# Patient Record
Sex: Female | Born: 1985 | Race: White | Hispanic: No | Marital: Married | Smoking: Never smoker
Health system: Southern US, Community
[De-identification: ages and names within clinical notes are randomized; demographics above are authoritative.]

## PROBLEM LIST (undated history)

## (undated) DIAGNOSIS — D649 Anemia, unspecified: Secondary | ICD-10-CM

## (undated) HISTORY — DX: Anemia, unspecified: D64.9

---
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
## 2006-01-09 ENCOUNTER — Ambulatory Visit: Payer: Self-pay | Admitting: Internal Medicine

## 2006-02-12 ENCOUNTER — Ambulatory Visit: Payer: Self-pay | Admitting: Otolaryngology

## 2006-02-15 ENCOUNTER — Emergency Department: Payer: Self-pay | Admitting: Emergency Medicine

## 2011-09-22 ENCOUNTER — Ambulatory Visit: Payer: Self-pay | Admitting: Internal Medicine

## 2011-09-22 LAB — RAPID INFLUENZA A&B ANTIGENS

## 2012-08-29 ENCOUNTER — Emergency Department: Payer: Self-pay | Admitting: Emergency Medicine

## 2013-12-23 IMAGING — CT CT HEAD WITHOUT CONTRAST
1 series · 16 of 30 positions shown, 20 images · non-contrast
Comparison: none

REASON FOR EXAM: headache & dizziness
COMMENTS:   May transport without cardiac monitor

PROCEDURE:     CT  - CT HEAD WITHOUT CONTRAST  - August 29, 2012  [DATE]
RESULT:     Technique: Helical 5mm sections were obtained from the skull
base to the vertex without administration of intravenous contrast.

[Series 2: soft tissue · axial · 0.42mm/px · z∈[+108,+242]mm · 16 of 30 slices shown, 20 images]
[im 2/30  brain]
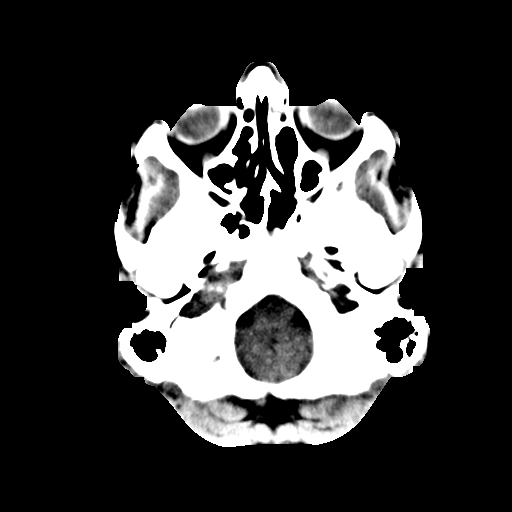
[im 2/30  bone]
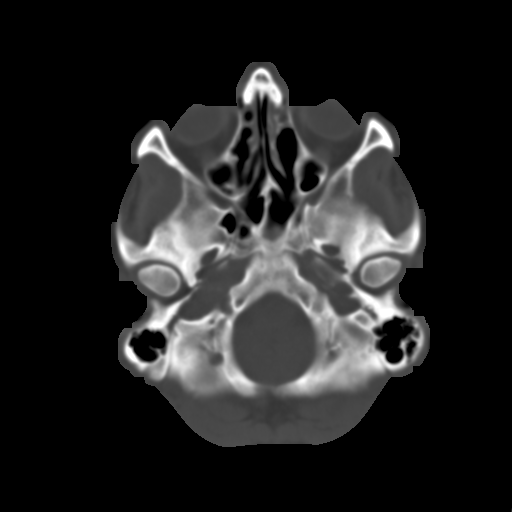
[im 4/30  brain]
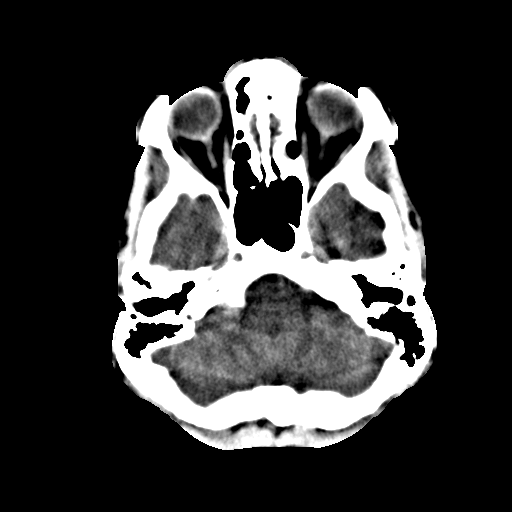
[im 6/30  brain]
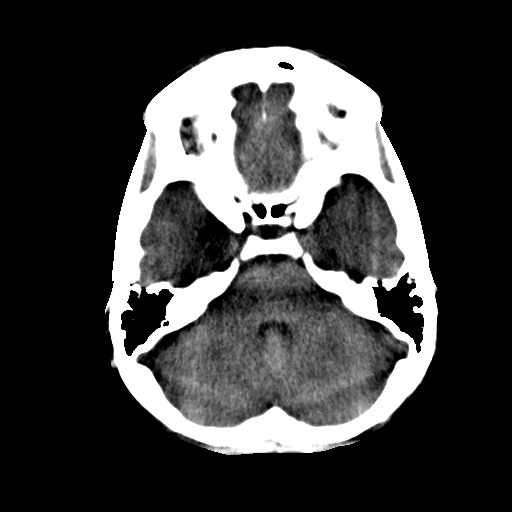
[im 8/30  brain]
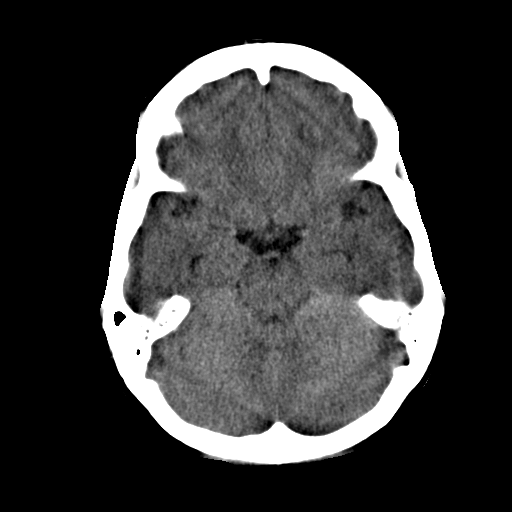
[im 9/30  brain]
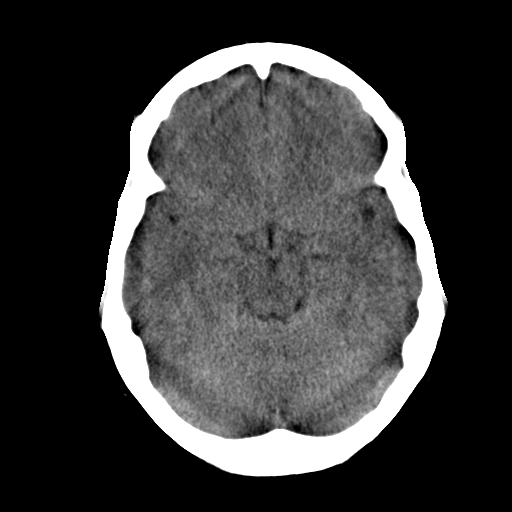
[im 9/30  bone]
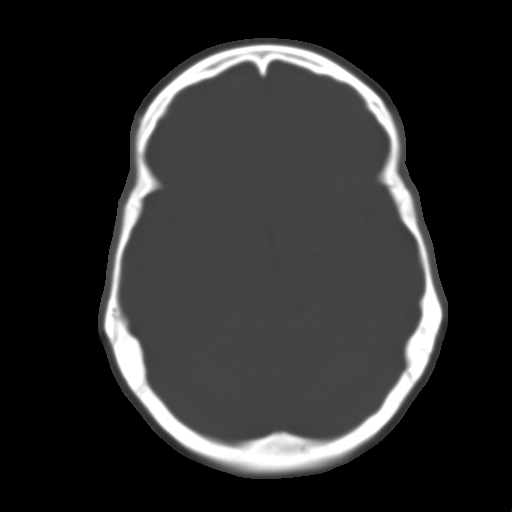
[im 11/30  brain]
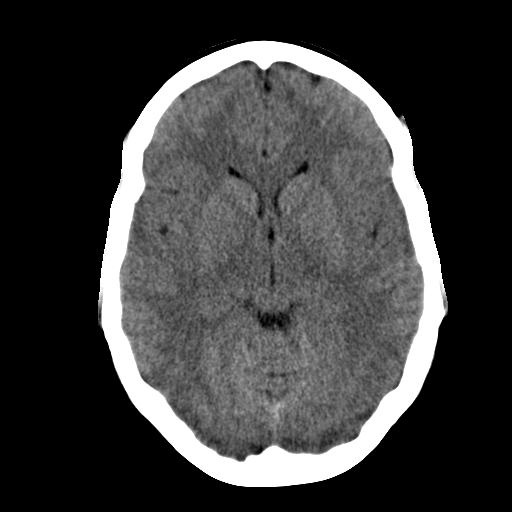
[im 13/30  brain]
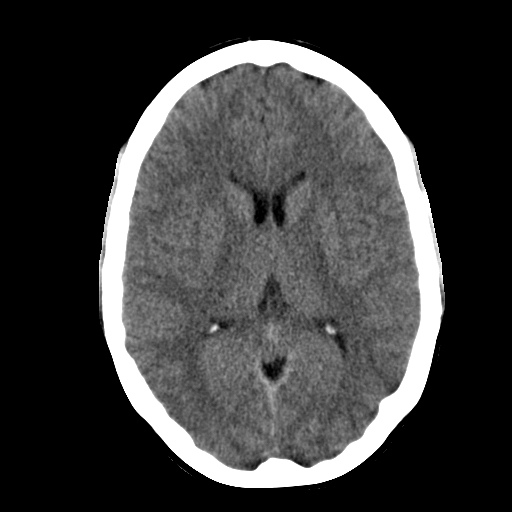
[im 15/30  brain]
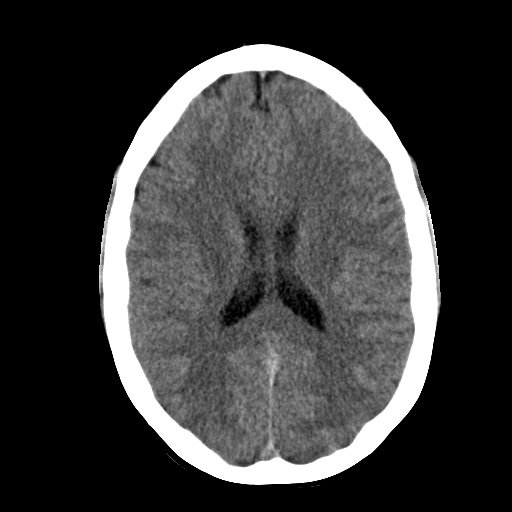
[im 16/30  brain]
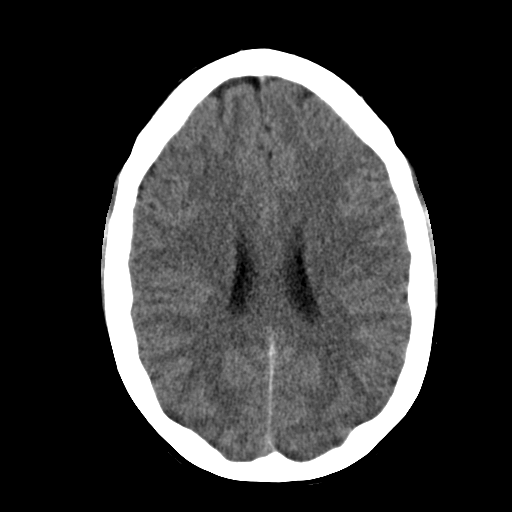
[im 16/30  bone]
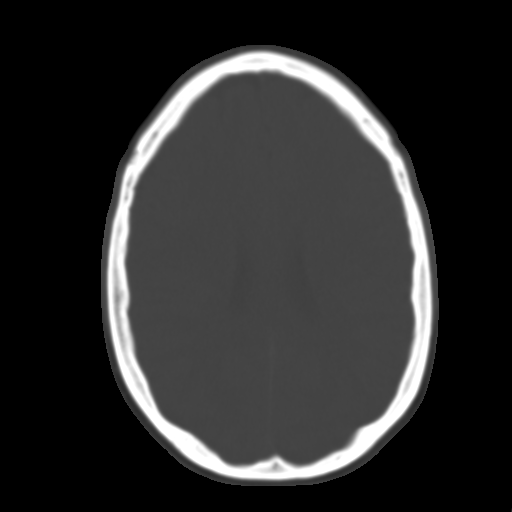
[im 18/30  brain]
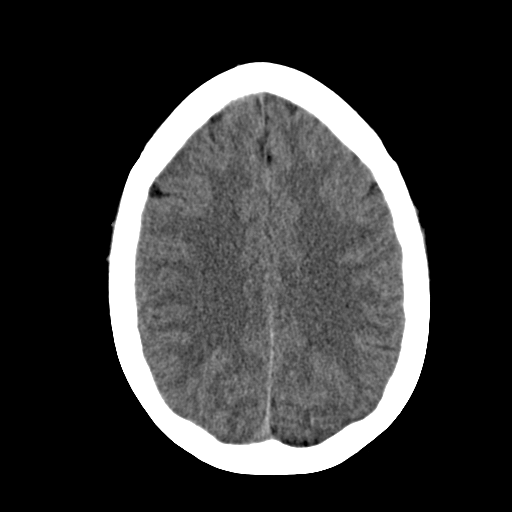
[im 20/30  brain]
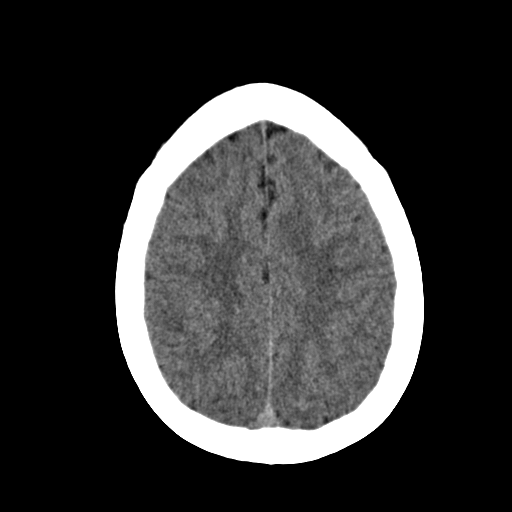
[im 22/30  brain]
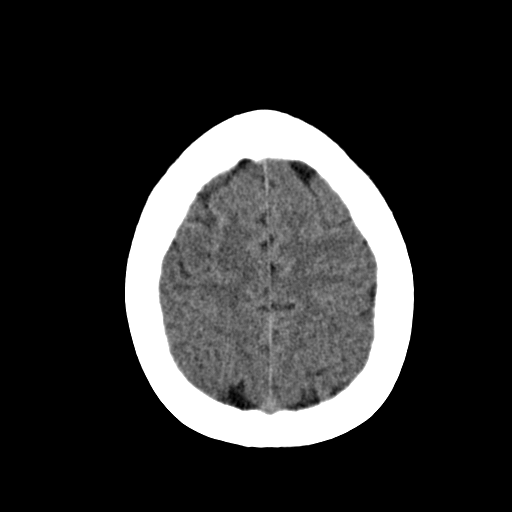
[im 23/30  brain]
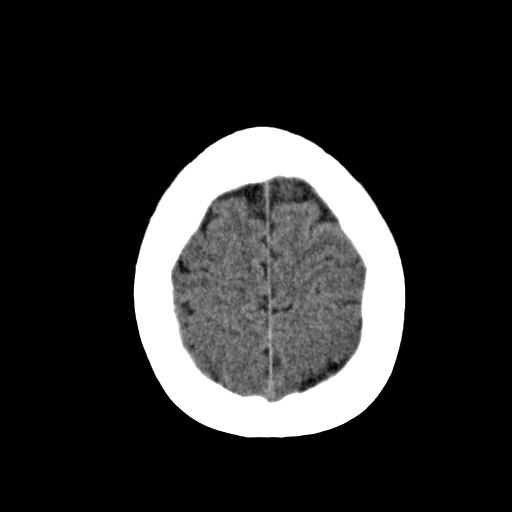
[im 23/30  bone]
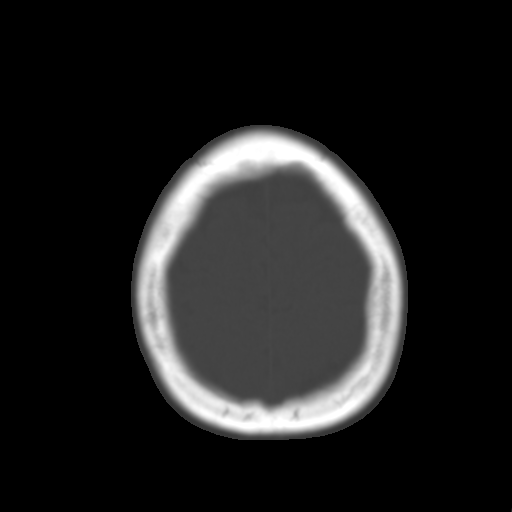
[im 25/30  brain]
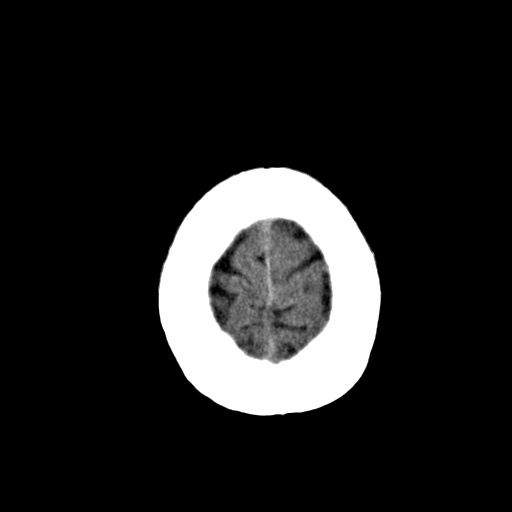
[im 27/30  brain]
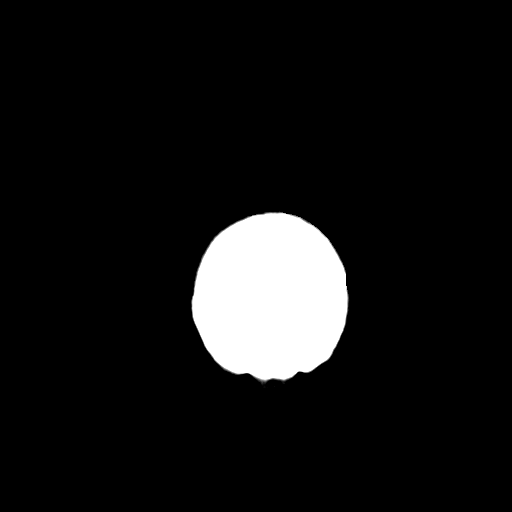
[im 29/30  brain]
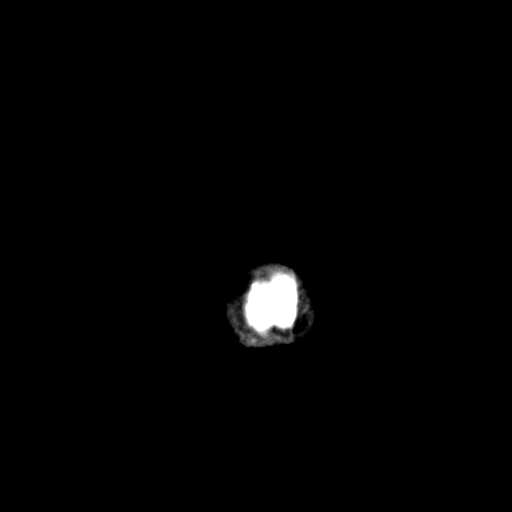

[16 of 30 positions shown; findings below may reference images not displayed]

FINDINGS: There is not evidence of intra-axial fluid collections. There is
no evidence of acute hemorrhage or secondary signs reflecting mass effect or
subacute or chronic focal territorial infarction. The osseous structures
demonstrate no evidence of a depressed skull fracture. If there is
persistent concern clinical follow-up with MRI is recommended. Mucosal
thickening is appreciated within the ethmoid air cells. The mastoid air
cells are patent.
IMPRESSION: 1. No evidence of acute intracranial abnormalities.
2. Findings which may represent sinus disease.

## 2014-12-15 DIAGNOSIS — G43909 Migraine, unspecified, not intractable, without status migrainosus: Secondary | ICD-10-CM | POA: Insufficient documentation

## 2014-12-15 HISTORY — DX: Migraine, unspecified, not intractable, without status migrainosus: G43.909

## 2015-05-31 DIAGNOSIS — M545 Low back pain, unspecified: Secondary | ICD-10-CM | POA: Insufficient documentation

## 2016-09-10 DIAGNOSIS — Z3403 Encounter for supervision of normal first pregnancy, third trimester: Secondary | ICD-10-CM | POA: Insufficient documentation

## 2016-12-06 DIAGNOSIS — J069 Acute upper respiratory infection, unspecified: Secondary | ICD-10-CM | POA: Insufficient documentation

## 2016-12-30 DIAGNOSIS — Z789 Other specified health status: Secondary | ICD-10-CM

## 2016-12-30 HISTORY — DX: Other specified health status: Z78.9

## 2019-08-06 ENCOUNTER — Ambulatory Visit: Payer: BC Managed Care – PPO | Admitting: Obstetrics and Gynecology

## 2019-09-20 ENCOUNTER — Ambulatory Visit: Payer: BC Managed Care – PPO | Admitting: Nurse Practitioner

## 2019-09-20 ENCOUNTER — Encounter: Payer: Self-pay | Admitting: Nurse Practitioner

## 2019-09-20 ENCOUNTER — Other Ambulatory Visit: Payer: Self-pay

## 2019-09-20 ENCOUNTER — Telehealth: Payer: Self-pay | Admitting: Nurse Practitioner

## 2019-09-20 VITALS — BP 132/78 | HR 90 | Temp 97.1°F | Resp 16 | Ht 64.5 in | Wt 149.0 lb

## 2019-09-20 DIAGNOSIS — H6123 Impacted cerumen, bilateral: Secondary | ICD-10-CM | POA: Diagnosis not present

## 2019-09-20 DIAGNOSIS — Z0000 Encounter for general adult medical examination without abnormal findings: Secondary | ICD-10-CM | POA: Diagnosis not present

## 2019-09-20 DIAGNOSIS — D509 Iron deficiency anemia, unspecified: Secondary | ICD-10-CM

## 2019-09-20 DIAGNOSIS — R5383 Other fatigue: Secondary | ICD-10-CM | POA: Insufficient documentation

## 2019-09-20 DIAGNOSIS — R030 Elevated blood-pressure reading, without diagnosis of hypertension: Secondary | ICD-10-CM

## 2019-09-20 DIAGNOSIS — R6889 Other general symptoms and signs: Secondary | ICD-10-CM | POA: Diagnosis not present

## 2019-09-20 LAB — COMPREHENSIVE METABOLIC PANEL
ALT: 12 U/L (ref 0–35)
AST: 18 U/L (ref 0–37)
Albumin: 4.5 g/dL (ref 3.5–5.2)
Alkaline Phosphatase: 55 U/L (ref 39–117)
BUN: 13 mg/dL (ref 6–23)
CO2: 25 mEq/L (ref 19–32)
Calcium: 9.6 mg/dL (ref 8.4–10.5)
Chloride: 107 mEq/L (ref 96–112)
Creatinine, Ser: 0.86 mg/dL (ref 0.40–1.20)
GFR: 76.35 mL/min (ref >60.00)
Glucose, Bld: 103 mg/dL — ABNORMAL HIGH (ref 70–99)
Potassium: 4 mEq/L (ref 3.5–5.1)
Sodium: 138 mEq/L (ref 135–145)
Total Bilirubin: 0.3 mg/dL (ref 0.2–1.2)
Total Protein: 7.6 g/dL (ref 6.0–8.3)

## 2019-09-20 LAB — LIPID PANEL
Cholesterol: 176 mg/dL (ref 0–200)
HDL: 64.6 mg/dL (ref >39.00)
LDL Cholesterol: 91 mg/dL (ref 0–99)
NonHDL: 111.3
Total CHOL/HDL Ratio: 3
Triglycerides: 102 mg/dL (ref 0.0–149.0)
VLDL: 20.4 mg/dL (ref 0.0–40.0)

## 2019-09-20 LAB — VITAMIN D 25 HYDROXY (VIT D DEFICIENCY, FRACTURES): VITD: 27.48 ng/mL — ABNORMAL LOW (ref 30.00–100.00)

## 2019-09-20 LAB — CBC
HCT: 33.1 % — ABNORMAL LOW (ref 36.0–46.0)
Hemoglobin: 10.6 g/dL — ABNORMAL LOW (ref 12.0–15.0)
MCHC: 32.2 g/dL (ref 30.0–36.0)
MCV: 69.8 fl — ABNORMAL LOW (ref 78.0–100.0)
Platelets: 226 10*3/uL (ref 150.0–400.0)
RBC: 4.74 Mil/uL (ref 3.87–5.11)
RDW: 16.7 % — ABNORMAL HIGH (ref 11.5–15.5)
WBC: 4.4 10*3/uL (ref 4.0–10.5)

## 2019-09-20 LAB — TSH: TSH: 3.33 u[IU]/mL (ref 0.35–4.50)

## 2019-09-20 NOTE — Patient Instructions (Addendum)
It was nice to meet you today.  Please see ENT to assist with cerumen impaction.  We were able to clean out the right ear with irrigation, the left ear remained fully impacted.  You have an elevated resting heart rate, and blood pressures above ideal goal which is less than 120/80. This may simply be due to feeling anxious during our first office visit.   Please check your heart rate and blood pressure with a cuff at home a couple times a week and write those records down.  We can reevaluate in 3 months.  First step is lower salt diet, regular exercise and healthy diet.   I have ordered your routine labs. You tested negative for depression and anxiety.   Follow up with your readings in 3 mos.   Addendum:  LABS returned with ANEMIA in a pattern of iron deficiency. This is likely form your 2018 child birth - Hgb 11.7 and your menses. Talk to our GYN about this . I  have added more blood work to look into this further.  Do not donate any blood.  Anemia can cause your resting heart rate to be elevated.   PLAN: 1. I added labs to check on the severity of the IDA.  2. Stool cards to be picked up at our front desk- to complete at home looking for hidden blood in your stool. 3.  Begin over the counter IRON: Ferrous sulfate 325 mg 1 pill every  day. Avoid taking it 2 hours from calcium-antacids, cereals, dietary fiber, teas, coffee and eggs.  4. Come in for repeat CBC in 6 weeks to check the results.  5. If you get stomach upset and constipation that a stool softener does not help, call me back.  CRITICAL ALERT: Severe iron toxicity is possible and overdose especially in children.  Store out of reach of children and in a child resistant container.  Iron overdose is a leading cause of poisoning in children younger than 51 years old. Storing your iron up high and in child proof container is very important.

## 2019-09-20 NOTE — Progress Notes (Signed)
Subjective:    Patient ID: Denise Cox, female    DOB: 06/26/1986, 34 y.o.   MRN: [id number]  HPI  34 yo comes in to establish care with a PCP. She declines flu shot.   1. Fatigue/cold intolerance: She reports a borderline normal thyroid level in the past and she wants to get it checked again because she has fatigue and cold intolerance. She has not had labs done in 5-6 years. She has been a full-time mother to her 20 1/2 yo and used to be an Environmental consultant principal. She is doing well and denies any feelings of depression. She is happy to be home.   2. Cerumen impaction: She feels like she has impacted wax in both her ears. She has never needed to get her ears cleaned out before. She has no sinus concerns now. 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 Some allergies and minor sinus congestion-does not treat.She started Claritin 2 days ago.   3. Weight gain: Some weight gain. She is eating better. Exercises regulary.   4. Tachycardia and elevated BP noted on intake vitals: Asymptomatic-unaware. No CP, SOB, DOE. She reports stress with new provider and medical anxiety.   Social: Wine one glass less than weekly. No smoking. She has used condoms for protection and will see GYN in 2 weeks for PAP. Mod-heavy menses.  FH: M HTN,  Mgm Hashimoto, Mat aunt Hashimoto   Past Medical History:  Diagnosis Date  . Anemia 2041m   With pregnancy Hgb 11.7  . Cold intolerance 09/20/2019     Past Surgical History:  Procedure Laterality Date  . ANKLE SURGERY Right 1998  . SINUSOTOMY  2001  . [place] EXTRACTION  2000    History reviewed. No pertinent family history.  Allergies  Allergen Reactions  . Augmentin [Amoxicillin-Pot Clavulanate] Diarrhea  . Avelox [Moxifloxacin] Other (See Comments)    Neuropathy  . Septra [Sulfamethoxazole-Trimethoprim] Diarrhea    No current outpatient medications on file prior to visit.   No current  facility-administered medications on file prior to visit.    BP 132/78   Pulse 90   Temp (!) 97.1 F (36.2 C) (Temporal)   Resp 16   Ht 5' 4.5" (1.638 m)   Wt 149 lb (67.6 kg)   LMP 08/25/2019 (Exact Date)   SpO2 99%   BMI 25.18 kg/m    Review of Systems Negative ROS with exceptions as mentions in HPI    Objective:   Physical Exam Vitals reviewed.  Constitutional:      Appearance: Normal appearance.  HENT:     Head: Normocephalic.     Right Ear: There is impacted cerumen.     Left Ear: There is impacted cerumen.     Nose: Nose normal.  Cardiovascular:     Rate and Rhythm: Regular rhythm. Tachycardia present.     Heart sounds: No murmur. No gallop.   Pulmonary:     Effort: Pulmonary effort is normal.     Breath sounds: Normal breath sounds.  Abdominal:     General: Abdomen is flat.     Palpations: Abdomen is soft.     Tenderness: There is no abdominal tenderness.  Musculoskeletal:        General: No swelling.     Cervical back: Normal range of motion.     Right lower leg: No edema.     Left lower leg: No edema.  Skin:  General: Skin is warm and dry.     Coloration: Skin is pale.     Findings: No rash.  Neurological:     Mental Status: She is alert and oriented to person, place, and time.  Psychiatric:        Mood and Affect: Mood normal.        Behavior: Behavior normal.     Comments: She denies any depression/anxiety concerns.        Assessment & Plan:   1. Cerumen impaction: Denise Cox attempted to clean out her ears.ENT to assist with cerumen impaction.  We were able to clean out the right ear with irrigation, the left ear remained fully impacted.  2. Fatigue and cold intolerance: she has thyroid concerns. Color of nailbeds pale pink. Previous anemia ain 2018 with pregnancy. Normal moderate menses  Will recheck labs.   3. Mild elevated BP and HR - may be white coat. Check lipids with labs.   She declines FLU vaccine. She plans to get the Covid vaccine  when it is offered to her.  This visit occurred during the SARS-CoV-2 public health emergency.  Safety protocols were in place, including screening questions prior to the visit, additional usage of staff PPE, and extensive cleaning of exam room while observing appropriate contact time as indicated for disinfecting solutions.   Amedeo Kinsman, ANP

## 2019-09-20 NOTE — Telephone Encounter (Signed)
I added on labs and she needs OV in 6 weeks for CBC re check.    Can you make sure lab can add these on? And, give her a sooner f/up appt?     

## 2019-09-21 ENCOUNTER — Other Ambulatory Visit: Payer: BC Managed Care – PPO

## 2019-09-21 ENCOUNTER — Telehealth: Payer: Self-pay | Admitting: Lab

## 2019-09-21 DIAGNOSIS — D509 Iron deficiency anemia, unspecified: Secondary | ICD-10-CM

## 2019-09-21 LAB — IBC + FERRITIN
Ferritin: 3.4 ng/mL — ABNORMAL LOW (ref 10.0–291.0)
Iron: 17 ug/dL — ABNORMAL LOW (ref 42–145)
Saturation Ratios: 3.6 % — ABNORMAL LOW (ref 20.0–50.0)
Transferrin: 335 mg/dL (ref 212.0–360.0)

## 2019-09-21 NOTE — Telephone Encounter (Signed)
Called Pt No answer, left a VM. I was calling Pt because NP Fransico Setters would like to see Pt sooner.  NP Fransico Setters would like to see Pt  in 6 weeks for a F/U appt and cancel the 3 month F/U appt.

## 2019-09-21 NOTE — Telephone Encounter (Signed)
Add on sheet faxed for IBC+Ferritin only

## 2019-09-21 NOTE — Telephone Encounter (Signed)
I added on labs and she needs OV in 6 weeks for CBC re check.    Can you make sure lab can add these on? And, give her a sooner f/up appt?

## 2019-09-22 ENCOUNTER — Telehealth: Payer: Self-pay | Admitting: Lab

## 2019-09-22 NOTE — Telephone Encounter (Signed)
Called Pt and told her that I have some stool cards in the office for her to pick up. I also was able to make the Pt a 6wk F/U appt on 11/03/19.

## 2019-10-04 ENCOUNTER — Encounter: Payer: Self-pay | Admitting: Obstetrics and Gynecology

## 2019-10-04 ENCOUNTER — Other Ambulatory Visit (HOSPITAL_COMMUNITY)
Admission: RE | Admit: 2019-10-04 | Discharge: 2019-10-04 | Disposition: A | Discharge status: Home / Self Care | Payer: BC Managed Care – PPO | Source: Ambulatory Visit | Attending: Obstetrics and Gynecology | Admitting: Obstetrics and Gynecology

## 2019-10-04 ENCOUNTER — Other Ambulatory Visit: Payer: Self-pay

## 2019-10-04 ENCOUNTER — Ambulatory Visit (INDEPENDENT_AMBULATORY_CARE_PROVIDER_SITE_OTHER): Payer: BC Managed Care – PPO | Admitting: Obstetrics and Gynecology

## 2019-10-04 VITALS — BP 118/74 | Ht 64.0 in | Wt 151.0 lb

## 2019-10-04 DIAGNOSIS — N941 Unspecified dyspareunia: Secondary | ICD-10-CM

## 2019-10-04 DIAGNOSIS — Z01419 Encounter for gynecological examination (general) (routine) without abnormal findings: Secondary | ICD-10-CM | POA: Insufficient documentation

## 2019-10-04 DIAGNOSIS — Z1331 Encounter for screening for depression: Secondary | ICD-10-CM

## 2019-10-04 DIAGNOSIS — Z1339 Encounter for screening examination for other mental health and behavioral disorders: Secondary | ICD-10-CM

## 2019-10-04 DIAGNOSIS — Z124 Encounter for screening for malignant neoplasm of cervix: Secondary | ICD-10-CM | POA: Diagnosis not present

## 2019-10-04 NOTE — Progress Notes (Signed)
Gynecology Annual Exam  PCP: Theadore Nan, NP  Chief Complaint  Patient presents with  . Annual Exam    History of Present Illness:  [name] is a 34 y.o. G1P1001 who LMP was Patient's last menstrual period was 09/20/2019., presents today for her annual examination.  Her menses are regular every 28-30 days, lasting 7 day(s).  Dysmenorrhea none. She does not have intermenstrual bleeding.  She is sexually active. She uses condoms for contraceptions.    Last Pap: about six years  Results were: no abnormalities  Hx of STDs: none  There is no FH of breast cancer. There is no FH of ovarian cancer. The patient does do self-breast exams.  Tobacco use: The patient denies current or previous tobacco use. Alcohol use: social drinker Exercise: chasing around her son (2.5 years)  The patient wears seatbelts: yes.   The patient reports that domestic violence in her life is absent.   She was recently diagnosed with what appears to be iron deficiency anemia.  She has started taking iron supplementation.  Her periods are not super heavy.  She wears an overnight pad, but this is not soaked.  She also has a hemorrhoid, which was bad after giving birth. She has flair ups since then, but this is significantly better.  She doesn't note any bleeding with having a bowel movement. She is having a little bit of a flare right now.    She has some pain at the onset of intercourse.  As time goes by she has less discomfort.  She has tried water based lubricants.  She states that it doesn't matter the level of penetration, though initial penetration is usually worse.    Past Medical History:  Diagnosis Date  . Anemia 2067m   With pregnancy Hgb 11.7   Past Surgical History:  Procedure Laterality Date  . ANKLE SURGERY Right 1998  . SINUSOTOMY  2001  . WISDOM TOOTH EXTRACTION  2000    Prior to Admission medications   Medication Sig Start Date End Date Taking? Authorizing Provider  ferrous  sulfate 325 (65 FE) MG tablet Take 325 mg by mouth daily with breakfast.   Yes [provider]  loratadine (CLARITIN) 10 MG tablet Take 10 mg by mouth daily.   Yes [provider]    Allergies  Allergen Reactions  . Augmentin [Amoxicillin-Pot Clavulanate] Diarrhea  . Avelox [Moxifloxacin] Other (See Comments)    Neuropathy  . Septra [Sulfamethoxazole-Trimethoprim] Diarrhea   Obstetric History: G1P1001, s/p SVD x 2 with 2nd degree laceration.   Social History   Socioeconomic History  . Marital status: Married    Spouse name: Not on file  . Number of children: Not on file  . Years of education: Not on file  . Highest education level: Not on file  Occupational History  . Not on file  Tobacco Use  . Smoking status: Never Smoker  . Smokeless tobacco: Never Used  Substance and Sexual Activity  . Alcohol use: Yes    Comment: ocassionally  . Drug use: Not Currently  . Sexual activity: Yes    Birth control/protection: None  Other Topics Concern  . Not on file  Social History Narrative  . Not on file   Social Determinants of Health   Financial Resource Strain:   . Difficulty of Paying Living Expenses:   Food Insecurity:   . Worried About Programme researcher, broadcasting/film/video in the Last Year:   . Barista in  the Last Year:   Transportation Needs:   . Freight forwarder (Medical):   Marland Kitchen Lack of Transportation (Non-Medical):   Physical Activity:   . Days of Exercise per Week:   . Minutes of Exercise per Session:   Stress:   . Feeling of Stress :   Social Connections:   . Frequency of Communication with Friends and Family:   . Frequency of Social Gatherings with Friends and Family:   . Attends Religious Services:   . Active Member of Clubs or Organizations:   . Attends Banker Meetings:   Marland Kitchen Marital Status:   Intimate Partner Violence:   . Fear of Current or Ex-Partner:   . Emotionally Abused:   Marland Kitchen Physically Abused:   . Sexually Abused:      Family History  Problem Relation Age of Onset  . Hypertension Maternal Grandmother   . Hypertension Mother     Review of Systems  Constitutional: Positive for malaise/fatigue. Negative for chills, diaphoresis, fever and weight loss.  HENT: Negative.   Eyes: Negative.   Respiratory: Negative.   Cardiovascular: Negative.   Gastrointestinal: Negative.   Genitourinary: Negative.        +dyspareunia  Musculoskeletal: Negative.   Skin: Negative.   Neurological: Positive for dizziness ("minor"). Negative for tremors, sensory change, speech change, focal weakness, seizures, loss of consciousness, weakness and headaches.  Endo/Heme/Allergies: Positive for environmental allergies. Negative for polydipsia. Does not bruise/bleed easily.  Psychiatric/Behavioral: Negative.      Physical Exam BP 118/74   Ht 5\' 4"  (1.626 m)   Wt 151 lb (68.5 kg)   LMP 09/20/2019   BMI 25.92 kg/m    Physical Exam Constitutional:      General: She is not in acute distress.    Appearance: Normal appearance. She is well-developed.  Genitourinary:     Pelvic exam was performed with patient in the lithotomy position.     Vulva, urethra, bladder and uterus normal.     No inguinal adenopathy present in the right or left side.    No signs of injury in the vagina.     No vaginal discharge, erythema, tenderness or bleeding.     No cervical motion tenderness, discharge, lesion or polyp.     Uterus is mobile.     Uterus is not enlarged or tender.     No uterine mass detected.    Uterus is anteverted.     No right or left adnexal mass present.     Right adnexa not tender or full.     Left adnexa not tender or full.     Genitourinary Comments: Mild ttp at pubic ramus bilaterally at 4 and 8 o'clock.  No visible or palpable lesions.   Small external hemorrhoid palpable.  Rectum:     External hemorrhoid present.  HENT:     Head: Normocephalic and atraumatic.  Eyes:     General: No scleral icterus.     Conjunctiva/sclera: Conjunctivae normal.  Neck:     Thyroid: No thyromegaly.  Cardiovascular:     Rate and Rhythm: Normal rate and regular rhythm.     Heart sounds: No murmur. No friction rub. No gallop.   Pulmonary:     Effort: Pulmonary effort is normal. No respiratory distress.     Breath sounds: Normal breath sounds. No wheezing or rales.  Chest:     Breasts:        Right: No inverted nipple, mass, nipple discharge, skin change or tenderness.  Left: No inverted nipple, mass, nipple discharge, skin change or tenderness.  Abdominal:     General: Bowel sounds are normal. There is no distension.     Palpations: Abdomen is soft. There is no mass.     Tenderness: There is no abdominal tenderness. There is no guarding or rebound.  Musculoskeletal:        General: No swelling or tenderness. Normal range of motion.     Cervical back: Normal range of motion and neck supple.  Lymphadenopathy:     Cervical: No cervical adenopathy.     Lower Body: No right inguinal adenopathy. No left inguinal adenopathy.  Neurological:     General: No focal deficit present.     Mental Status: She is alert and oriented to person, place, and time.     Cranial Nerves: No cranial nerve deficit.  Skin:    General: Skin is warm and dry.     Findings: No erythema or rash.  Psychiatric:        Mood and Affect: Mood normal.        Behavior: Behavior normal.        Judgment: Judgment normal.    Female chaperone present for pelvic and breast  portions of the physical exam  Results: AUDIT Questionnaire (screen for alcoholism): 2 PHQ-9: 4  Assessment: 34 y.o. G32P1001 female here for routine annual gynecologic examination  Plan: Problem List Items Addressed This Visit    None    Visit Diagnoses    Women's annual routine gynecological examination    -  Primary   Relevant Orders   Cytology - PAP   Screening for depression       Screening for alcoholism       Pap smear for cervical cancer screening        Relevant Orders   Cytology - PAP   Dyspareunia in female         Screening: -- Blood pressure screen normal -- Weight screening: normal -- Depression screening negative (PHQ-9) -- Nutrition: normal -- cholesterol screening: not due for screening -- osteoporosis screening: not due -- tobacco screening: not using -- alcohol screening: AUDIT questionnaire indicates low-risk usage. -- family history of breast cancer screening: done. not at high risk. -- no evidence of domestic violence or intimate partner violence. -- STD screening: gonorrhea/chlamydia NAAT not collected per patient request. -- pap smear collected per ASCCP guidelines -- flu vaccine declined this year -- HPV vaccination series: unsure. She will check her records.  Dyspareunia: Discussed that no pathology is evident. There are many reasons a woman could have chronic dyspareunia. There does appear to be a focal area of tenderness on exam.  She is already using a topical lubricant.  Surgery in this case does not appear to be indicated.  Next step would be pelvic floor PT.  Iron deficiency anemia: per PCP.  Based on her report of her menses, she doesn't appear to have such a significant amount of menstrual bleeding that she would be iron deficient. I encouraged her to continue to work with her PCP. We could certainly lighten her periods to some extent. However, I would not want to delay another diagnosis, unless there is a true dietary deficiency.  However, this would not be known without a further workup.  Continue with oral iron therapy to help with symptoms.   Prentice Docker, MD 10/04/2019 12:30 PM

## 2019-10-06 NOTE — Progress Notes (Signed)
She saw GYN and his impression was not enough of menstrual bleeding to cause her IDA. I had already ordered further lab tests to investigate IDA and they are in the computer-but not done. Can she come in to get those done? Do they need re entered.

## 2019-10-08 LAB — CYTOLOGY - PAP
Comment: NEGATIVE
Diagnosis: NEGATIVE
High risk HPV: NEGATIVE

## 2019-10-08 NOTE — Progress Notes (Signed)
I used the labs you have ordered and rescheduled patient a lab appt for 10/18/19. Patient also has FU with you in 3 weeks.

## 2019-10-08 NOTE — Progress Notes (Signed)
Please let me know, if you have any questions or would like to discuss further regarding her IDA as it might relate to menstruation.

## 2019-10-18 ENCOUNTER — Other Ambulatory Visit: Payer: Self-pay

## 2019-10-18 ENCOUNTER — Other Ambulatory Visit: Payer: BC Managed Care – PPO

## 2019-10-18 DIAGNOSIS — D509 Iron deficiency anemia, unspecified: Secondary | ICD-10-CM

## 2019-10-18 LAB — RETICULOCYTES
ABS Retic: 59040 cells/uL (ref 20000–8000)
Retic Ct Pct: 1.2 %

## 2019-10-19 LAB — CELIAC DISEASE AB SCREEN W/RFX
Antigliadin Abs, IgA: 2 units (ref 0–19)
IgA/Immunoglobulin A, Serum: 176 mg/dL (ref 87–352)
Transglutaminase IgA: <2 U/mL (ref 0–3)

## 2019-11-03 ENCOUNTER — Other Ambulatory Visit: Payer: Self-pay

## 2019-11-03 ENCOUNTER — Ambulatory Visit: Payer: BC Managed Care – PPO | Admitting: Nurse Practitioner

## 2019-11-03 ENCOUNTER — Encounter: Payer: Self-pay | Admitting: Nurse Practitioner

## 2019-11-03 VITALS — BP 114/76 | HR 102 | Temp 98.1°F | Ht 64.0 in | Wt 147.4 lb

## 2019-11-03 DIAGNOSIS — D509 Iron deficiency anemia, unspecified: Secondary | ICD-10-CM

## 2019-11-03 LAB — CBC
HCT: 39.7 % (ref 36.0–46.0)
Hemoglobin: 13.1 g/dL (ref 12.0–15.0)
MCHC: 33 g/dL (ref 30.0–36.0)
MCV: 79.1 fl (ref 78.0–100.0)
Platelets: 211 10*3/uL (ref 150.0–400.0)
RBC: 5.02 Mil/uL (ref 3.87–5.11)
RDW: 24.7 % — ABNORMAL HIGH (ref 11.5–15.5)
WBC: 4.8 10*3/uL (ref 4.0–10.5)

## 2019-11-03 NOTE — Progress Notes (Signed)
Established Patient Office Visit  Subjective:  Patient ID: Denise Cox, female    DOB: 11-Jan-1986  Age: 34 y.o. MRN: [id number]  CC:  Chief Complaint  Patient presents with  . Follow-up    HPI Denise Cox presents for follow up of IDA with microcytosis  on ferrous sulfate one tab daily. She is doing very well and is 70% better - much less fatigue and no cold intolerance.  She has history of heart rate at home and blood pressure 120-125/77 heart rate 80, 111/70 with heart rate 78 range.    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 She recalls a small hemorrhage after her delivery in 2018. She saw her gynecologist 2 weeks ago and pelvic exam was normal.  She uses condom for BCP. 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 LMP is normal and not heavy. She has a 7 day cycle, slightly heavier for 4 days and tapers off. I ordered hemoccult as work up for IDA. She had no GI concerns. Taking colace daily- and had some straining and hemorrhoid.   Past Medical History:  Diagnosis Date  . Anemia 2037m   With pregnancy Hgb 11.7    Past Surgical History:  Procedure Laterality Date  . ANKLE SURGERY Right 1998  . SINUSOTOMY  2001  . WISDOM TOOTH EXTRACTION  2000    Family History  Problem Relation Age of Onset  . Hypertension Maternal Grandmother   . Hypertension Mother     Social History   Socioeconomic History  . Marital status: Married    Spouse name: Not on file  . Number of children: Not on file  . Years of education: Not on file  . Highest education level: Not on file  Occupational History  . Not on file  Tobacco Use  . Smoking status: Never Smoker  . Smokeless tobacco: Never Used  Substance and Sexual Activity  . Alcohol use: Yes    Comment: ocassionally  . Drug use: Not Currently  . Sexual activity: Yes    Birth control/protection: None  Other Topics Concern  . Not on file  Social History Narrative  . Not on file   Social  Determinants of Health   Financial Resource Strain:   . Difficulty of Paying Living Expenses:   Food Insecurity:   . Worried About Programme researcher, broadcasting/film/video in the Last Year:   . Barista in the Last Year:   Transportation Needs:   . Freight forwarder (Medical):   Marland Kitchen Lack of Transportation (Non-Medical):   Physical Activity:   . Days of Exercise per Week:   . Minutes of Exercise per Session:   Stress:   . Feeling of Stress :   Social Connections:   . Frequency of Communication with Friends and Family:   . Frequency of Social Gatherings with Friends and Family:   . Attends Religious Services:   . Active Member of Clubs or Organizations:   . Attends Banker Meetings:   Marland Kitchen Marital Status:   Intimate Partner Violence:   . Fear of Current or Ex-Partner:   . Emotionally Abused:   Marland Kitchen Physically Abused:   . Sexually Abused:     Outpatient Medications Prior to Visit  Medication Sig Dispense Refill  . ferrous sulfate 325 (65 FE) MG tablet Take 325 mg by mouth daily with breakfast.    . loratadine (CLARITIN) 10 MG tablet Take 10 mg by mouth  daily.     No facility-administered medications prior to visit.    Allergies  Allergen Reactions  . Augmentin [Amoxicillin-Pot Clavulanate] Diarrhea  . Avelox [Moxifloxacin] Other (See Comments)    Neuropathy  . Septra [Sulfamethoxazole-Trimethoprim] Diarrhea     Review of Systems  Constitutional: Negative.   HENT: Negative for ear discharge and ear pain.        Cerumen- impaction- no longer symptomatic.   Gastrointestinal: Positive for constipation.       Mild- onset with oral iron and resolved with one Colace daily. She will turn in hemoccult cards.   Endocrine: Negative for cold intolerance.  Genitourinary: Negative.   Musculoskeletal: Negative.   Neurological: Negative.      Objective:    Physical Exam  Constitutional: She is oriented to person, place, and time.  HENT:  Head: Normocephalic and atraumatic.   Cardiovascular: Normal rate and regular rhythm.  Pulmonary/Chest: Effort normal and breath sounds normal.  Abdominal: Soft. Bowel sounds are normal.  Neurological: She is alert and oriented to person, place, and time.  Skin: Skin is warm and dry.  Psychiatric: She has a normal mood and affect. Her behavior is normal.    BP 114/76   Pulse (!) 102   Temp 98.1 F (36.7 C) (Skin)   Ht 5\' 4"  (1.626 m)   Wt 147 lb 6.4 oz (66.9 kg)   LMP 10/20/2019   SpO2 96%   BMI 25.30 kg/m  Wt Readings from Last 3 Encounters:  11/03/19 147 lb 6.4 oz (66.9 kg)  10/04/19 151 lb (68.5 kg)  09/20/19 149 lb (67.6 kg)     Health Maintenance Due  Topic Date Due  . HIV Screening  Never done    There are no preventive care reminders to display for this patient.  Lab Results  Component Value Date   TSH 3.33 09/20/2019   Lab Results  Component Value Date   WBC 4.4 09/20/2019   HGB 10.6 (L) 09/20/2019   HCT 33.1 (L) 09/20/2019   MCV 69.8 Repeated and verified X2. (L) 09/20/2019   PLT 226.0 09/20/2019   Lab Results  Component Value Date   NA 138 09/20/2019   K 4.0 09/20/2019   CO2 25 09/20/2019   GLUCOSE 103 (H) 09/20/2019   BUN 13 09/20/2019   CREATININE 0.86 09/20/2019   BILITOT 0.3 09/20/2019   ALKPHOS 55 09/20/2019   AST 18 09/20/2019   ALT 12 09/20/2019   PROT 7.6 09/20/2019   ALBUMIN 4.5 09/20/2019   CALCIUM 9.6 09/20/2019   GFR 76.35 09/20/2019   Lab Results  Component Value Date   CHOL 176 09/20/2019   Lab Results  Component Value Date   HDL 64.60 09/20/2019   Lab Results  Component Value Date   LDLCALC 91 09/20/2019   Lab Results  Component Value Date   TRIG 102.0 09/20/2019   Lab Results  Component Value Date   CHOLHDL 3 09/20/2019   No results found for: HGBA1C    Assessment & Plan:   Problem List Items Addressed This Visit      Other   IDA (iron deficiency anemia) - Primary    IDA with microcytosis and very low ferritin likely from post partum iron  loss in 2018 .   Continue with current iron supplement with ferrous sulfate 325 mg daily.  She is taking Colace for drug-induced mild constipation and  that is working well.  We will check hemoglobin today, may be able to change her iron  to every other day.  Continue the course for 3 months with office visit and repeat labs with iron panel at that time.          Relevant Orders   CBC   IBC + Ferritin     Continue with current iron plans, Colace is fine for drug induced constipation.   Once you have no  concerns about hemorrhoids or straining to have a stool,please  do the stool card.  Repeat CBC with iron panel in 3 mos.   No orders of the defined types were placed in this encounter.   Follow-up: Return in about 3 months (around 02/02/2020).  This visit occurred during the SARS-CoV-2 public health emergency.  Safety protocols were in place, including screening questions prior to the visit, additional usage of staff PPE, and extensive cleaning of exam room while observing appropriate contact time as indicated for disinfecting solutions.    Amedeo Kinsman, NP

## 2019-11-03 NOTE — Patient Instructions (Signed)
It is great to see you again today making.  You are doing very well.  Continue with current iron plans, Colace is fine for drug induced constipation.   Once you have no  concerns about hemorrhoids or straining to have a stool,please  do the stool card.  Repeat CBC with iron panel in 3 mos.

## 2019-11-03 NOTE — Addendum Note (Signed)
Addended by: Warden Fillers on: 11/03/2019 09:15 AM   Modules accepted: Orders

## 2019-11-03 NOTE — Assessment & Plan Note (Signed)
IDA with microcytosis and very low ferritin likely from post partum iron loss in 2018 .   Continue with current iron supplement with ferrous sulfate 325 mg daily.  She is taking Colace for drug-induced mild constipation and  that is working well.  We will check hemoglobin today, may be able to change her iron to every other day.  Continue the course for 3 months with office visit and repeat labs with iron panel at that time.

## 2019-11-03 NOTE — Progress Notes (Signed)
Pre visit review using our clinic review tool, if applicable. No additional management support is needed unless otherwise documented below in the visit note. 

## 2019-11-04 ENCOUNTER — Other Ambulatory Visit: Payer: Self-pay | Admitting: Nurse Practitioner

## 2019-11-04 DIAGNOSIS — D509 Iron deficiency anemia, unspecified: Secondary | ICD-10-CM

## 2019-11-05 ENCOUNTER — Telehealth: Payer: Self-pay | Admitting: Nurse Practitioner

## 2019-11-05 NOTE — Telephone Encounter (Signed)
See result note.  

## 2019-11-05 NOTE — Telephone Encounter (Signed)
Pt returned your call.  

## 2019-12-21 ENCOUNTER — Ambulatory Visit: Payer: BC Managed Care – PPO | Admitting: Nurse Practitioner

## 2019-12-28 ENCOUNTER — Other Ambulatory Visit (INDEPENDENT_AMBULATORY_CARE_PROVIDER_SITE_OTHER): Payer: BC Managed Care – PPO

## 2019-12-28 ENCOUNTER — Other Ambulatory Visit: Payer: Self-pay

## 2019-12-28 DIAGNOSIS — D509 Iron deficiency anemia, unspecified: Secondary | ICD-10-CM | POA: Diagnosis not present

## 2019-12-28 LAB — CBC WITH DIFFERENTIAL/PLATELET
Basophils Absolute: 0 10*3/uL (ref 0.0–0.1)
Basophils Relative: 0.6 % (ref 0.0–3.0)
Eosinophils Absolute: 0.1 10*3/uL (ref 0.0–0.7)
Eosinophils Relative: 1.4 % (ref 0.0–5.0)
HCT: 38.5 % (ref 36.0–46.0)
Hemoglobin: 13.1 g/dL (ref 12.0–15.0)
Lymphocytes Relative: 29.6 % (ref 12.0–46.0)
Lymphs Abs: 1.5 10*3/uL (ref 0.7–4.0)
MCHC: 34 g/dL (ref 30.0–36.0)
MCV: 84 fl (ref 78.0–100.0)
Monocytes Absolute: 0.4 10*3/uL (ref 0.1–1.0)
Monocytes Relative: 8.7 % (ref 3.0–12.0)
Neutro Abs: 3 10*3/uL (ref 1.4–7.7)
Neutrophils Relative %: 59.7 % (ref 43.0–77.0)
Platelets: 194 10*3/uL (ref 150.0–400.0)
RBC: 4.59 Mil/uL (ref 3.87–5.11)
RDW: 15.3 % (ref 11.5–15.5)
WBC: 5 10*3/uL (ref 4.0–10.5)

## 2019-12-28 LAB — IBC + FERRITIN
Ferritin: 10.2 ng/mL (ref 10.0–291.0)
Iron: 65 ug/dL (ref 42–145)
Saturation Ratios: 16.6 % — ABNORMAL LOW (ref 20.0–50.0)
Transferrin: 279 mg/dL (ref 212.0–360.0)

## 2019-12-28 NOTE — Addendum Note (Signed)
Addended by: Bonnell Public I on: 12/28/2019 08:20 AM   Modules accepted: Orders

## 2019-12-30 ENCOUNTER — Telehealth: Payer: Self-pay

## 2019-12-30 NOTE — Telephone Encounter (Signed)
Patient aware of results and confirmed she is taking Ferrous Sulfate 325 mg qod. She will continue and follow up in July. I dont have access to your box to document this in the result note.

## 2020-02-02 ENCOUNTER — Ambulatory Visit: Payer: BC Managed Care – PPO | Admitting: Nurse Practitioner

## 2020-02-16 ENCOUNTER — Other Ambulatory Visit: Payer: Self-pay | Admitting: *Deleted

## 2020-02-16 ENCOUNTER — Other Ambulatory Visit (INDEPENDENT_AMBULATORY_CARE_PROVIDER_SITE_OTHER): Payer: BC Managed Care – PPO

## 2020-02-16 DIAGNOSIS — R195 Other fecal abnormalities: Secondary | ICD-10-CM

## 2020-02-16 DIAGNOSIS — D509 Iron deficiency anemia, unspecified: Secondary | ICD-10-CM

## 2020-02-16 LAB — FECAL OCCULT BLOOD, IMMUNOCHEMICAL: Fecal Occult Bld: NEGATIVE

## 2020-02-17 NOTE — Progress Notes (Signed)
Feel free to correct the diagnosis. No order was in and the lab asked me to enter a order so they could run in. Please remember to always place a future order for IFOBS's so I want have to figure out a diagnosis.

## 2020-02-17 NOTE — Progress Notes (Signed)
I understand. I changed it. I was just wondering if I missed a hx of heme pos stool somewhere as I couldn't find it. Thank you.

## 2020-02-29 ENCOUNTER — Encounter: Payer: Self-pay | Admitting: Nurse Practitioner

## 2020-02-29 ENCOUNTER — Ambulatory Visit (INDEPENDENT_AMBULATORY_CARE_PROVIDER_SITE_OTHER): Payer: BC Managed Care – PPO | Admitting: Nurse Practitioner

## 2020-02-29 ENCOUNTER — Other Ambulatory Visit: Payer: Self-pay

## 2020-02-29 VITALS — BP 110/72 | HR 99 | Temp 98.3°F | Ht 64.0 in | Wt 143.0 lb

## 2020-02-29 DIAGNOSIS — D500 Iron deficiency anemia secondary to blood loss (chronic): Secondary | ICD-10-CM | POA: Diagnosis not present

## 2020-02-29 DIAGNOSIS — Z0000 Encounter for general adult medical examination without abnormal findings: Secondary | ICD-10-CM | POA: Diagnosis not present

## 2020-02-29 DIAGNOSIS — R5383 Other fatigue: Secondary | ICD-10-CM

## 2020-02-29 DIAGNOSIS — R7989 Other specified abnormal findings of blood chemistry: Secondary | ICD-10-CM | POA: Insufficient documentation

## 2020-02-29 DIAGNOSIS — H6123 Impacted cerumen, bilateral: Secondary | ICD-10-CM

## 2020-02-29 LAB — IBC + FERRITIN
Ferritin: 11.8 ng/mL (ref 10.0–291.0)
Iron: 92 ug/dL (ref 42–145)
Saturation Ratios: 24.6 % (ref 20.0–50.0)
Transferrin: 267 mg/dL (ref 212.0–360.0)

## 2020-02-29 LAB — B12 AND FOLATE PANEL
Folate: >24.8 ng/mL (ref >5.9)
Vitamin B-12: 338 pg/mL (ref 211–911)

## 2020-02-29 LAB — VITAMIN D 25 HYDROXY (VIT D DEFICIENCY, FRACTURES): VITD: 30.86 ng/mL (ref 30.00–100.00)

## 2020-02-29 LAB — HEMOGLOBIN A1C: Hgb A1c MFr Bld: 4.8 % (ref 4.6–6.5)

## 2020-02-29 NOTE — Patient Instructions (Addendum)
Labs today for follow-up on iron panel, B12, vitamin D, 51-monthaverage blood sugar.  Instructions regarding vitamin D supplementation, iron supplementation pending results.   I put a referral to ENT to help you get your earwax removed.  Dr. MTami Ribascan also check to the lymph node concerns.  Debrox - over the counter weekly once cerumen is resolved. ENT may have other recs.     Preventive Care [age] Old, Female Preventive care refers to visits with your health care provider and lifestyle choices that can promote health and wellness. This includes:  A yearly physical exam. This may also be called an annual well check.  Regular dental visits and eye exams.  Immunizations.  Screening for certain conditions.  Healthy lifestyle choices, such as eating a healthy diet, getting regular exercise, not using drugs or products that contain nicotine and tobacco, and limiting alcohol use. What can I expect for my preventive care visit? Physical exam Your health care provider will check your:  Height and weight. This may be used to calculate body mass index (BMI), which tells if you are at a healthy weight.  Heart rate and blood pressure.  Skin for abnormal spots. Counseling Your health care provider may ask you questions about your:  Alcohol, tobacco, and drug use.  Emotional well-being.  Home and relationship well-being.  Sexual activity.  Eating habits.  Work and work eStatistician  Method of birth control.  Menstrual cycle.  Pregnancy history. What immunizations do I need?  Influenza (flu) vaccine  This is recommended every year. Tetanus, diphtheria, and pertussis (Tdap) vaccine  You may need a Td booster every 10 years. Varicella (chickenpox) vaccine  You may need this if you have not been vaccinated. Human papillomavirus (HPV) vaccine  If recommended by your health care provider, you may need three doses over 6 months. Measles, mumps, and rubella (MMR)  vaccine  You may need at least one dose of MMR. You may also need a second dose. Meningococcal conjugate (MenACWY) vaccine  One dose is recommended if you are age 33-21 years and a first-year college student living in a residence hall, or if you have one of several medical conditions. You may also need additional booster doses. Pneumococcal conjugate (PCV13) vaccine  You may need this if you have certain conditions and were not previously vaccinated. Pneumococcal polysaccharide (PPSV23) vaccine  You may need one or two doses if you smoke cigarettes or if you have certain conditions. Hepatitis A vaccine  You may need this if you have certain conditions or if you travel or work in places where you may be exposed to hepatitis A. Hepatitis B vaccine  You may need this if you have certain conditions or if you travel or work in places where you may be exposed to hepatitis B. Haemophilus influenzae type b (Hib) vaccine  You may need this if you have certain conditions. You may receive vaccines as individual doses or as more than one vaccine together in one shot (combination vaccines). Talk with your health care provider about the risks and benefits of combination vaccines. What tests do I need?  Blood tests  Lipid and cholesterol levels. These may be checked every 5 years starting at age 33  Hepatitis C test.  Hepatitis B test. Screening  Diabetes screening. This is done by checking your blood sugar (glucose) after you have not eaten for a while (fasting).  Sexually transmitted disease (STD) testing.  BRCA-related cancer screening. This may be done if you have  a family history of breast, ovarian, tubal, or peritoneal cancers.  Pelvic exam and Pap test. This may be done every 3 years starting at age 33. Starting at age 25, this may be done every 5 years if you have a Pap test in combination with an HPV test. Talk with your health care provider about your test results, treatment  options, and if necessary, the need for more tests. Follow these instructions at home: Eating and drinking   Eat a diet that includes fresh fruits and vegetables, whole grains, lean protein, and low-fat dairy.  Take vitamin and mineral supplements as recommended by your health care provider.  Do not drink alcohol if: ? Your health care provider tells you not to drink. ? You are pregnant, may be pregnant, or are planning to become pregnant.  If you drink alcohol: ? Limit how much you have to 0-1 drink a day. ? Be aware of how much alcohol is in your drink. In the [place] one drink equals one 12 oz bottle of beer (355 mL), one 5 oz glass of wine (148 mL), or one 1 oz glass of hard liquor (44 mL). Lifestyle  Take daily care of your teeth and gums.  Stay active. Exercise for at least 30 minutes on 5 or more days each week.  Do not use any products that contain nicotine or tobacco, such as cigarettes, e-cigarettes, and chewing tobacco. If you need help quitting, ask your health care provider.  If you are sexually active, practice safe sex. Use a condom or other form of birth control (contraception) in order to prevent pregnancy and STIs (sexually transmitted infections). If you plan to become pregnant, see your health care provider for a preconception visit. What's next?  Visit your health care provider once a year for a well check visit.  Ask your health care provider how often you should have your eyes and teeth checked.  Stay up to date on all vaccines. This information is not intended to replace advice given to you by your health care provider. Make sure you discuss any questions you have with your health care provider. Document Revised: 03/12/2018 Document Reviewed: 03/12/2018 Elsevier Patient Education  2020 [employer].

## 2020-02-29 NOTE — Progress Notes (Signed)
Established Patient Office Visit  Subjective:  Patient ID: Denise Cox, female    DOB: January 09, 1986  Age: 34 y.o. MRN: [id number]  CC:  Chief Complaint  Patient presents with  . Follow-up    needs labs    HPI Denise Cox presents for follow up IDA, fatigue, cerumen impaction, and routine labs. She is feeling well and has no concerns.   IDA: Improving on oral iron and had negative hemoccult. No GI concerns. Feeling well-some fatigue. Had a left neck lymph node enlarged x 2 month and it was a little tender. It is almost gone now. No ear ache- but hearing muffled. Did not use Debrox.   Immunizations: Tdap- UTD- no record of others and pt does not know Diet:healthy Exercise: yes Pap Smear: [facility] normal PAP Dental: UTD  Vision: no   Past Medical History:  Diagnosis Date  . Anemia 2039m   With pregnancy Hgb 11.7    Past Surgical History:  Procedure Laterality Date  . ANKLE SURGERY Right 1998  . SINUSOTOMY  2001  . WISDOM TOOTH EXTRACTION  2000    Family History  Problem Relation Age of Onset  . Hypertension Maternal Grandmother   . Hypertension Mother     Social History   Socioeconomic History  . Marital status: Married    Spouse name: Not on file  . Number of children: Not on file  . Years of education: Not on file  . Highest education level: Not on file  Occupational History  . Not on file  Tobacco Use  . Smoking status: Never Smoker  . Smokeless tobacco: Never Used  Vaping Use  . Vaping Use: Never used  Substance and Sexual Activity  . Alcohol use: Yes    Comment: ocassionally  . Drug use: Not Currently  . Sexual activity: Yes    Birth control/protection: None  Other Topics Concern  . Not on file  Social History Narrative  . Not on file   Social Determinants of Health   Financial Resource Strain:   . Difficulty of Paying Living Expenses: Not on file  Food Insecurity:   . Worried About Programme researcher, broadcasting/film/video in the Last Year: Not on  file  . Ran Out of Food in the Last Year: Not on file  Transportation Needs:   . Lack of Transportation (Medical): Not on file  . Lack of Transportation (Non-Medical): Not on file  Physical Activity:   . Days of Exercise per Week: Not on file  . Minutes of Exercise per Session: Not on file  Stress:   . Feeling of Stress : Not on file  Social Connections:   . Frequency of Communication with Friends and Family: Not on file  . Frequency of Social Gatherings with Friends and Family: Not on file  . Attends Religious Services: Not on file  . Active Member of Clubs or Organizations: Not on file  . Attends Banker Meetings: Not on file  . Marital Status: Not on file  Intimate Partner Violence:   . Fear of Current or Ex-Partner: Not on file  . Emotionally Abused: Not on file  . Physically Abused: Not on file  . Sexually Abused: Not on file    Outpatient Medications Prior to Visit  Medication Sig Dispense Refill  . ferrous sulfate 325 (65 FE) MG tablet Take 325 mg by mouth daily with breakfast.    . loratadine (CLARITIN) 10 MG tablet Take 10 mg by mouth daily.  No facility-administered medications prior to visit.    Allergies  Allergen Reactions  . Augmentin [Amoxicillin-Pot Clavulanate] Diarrhea  . Avelox [Moxifloxacin] Other (See Comments)    Neuropathy  . Septra [Sulfamethoxazole-Trimethoprim] Diarrhea    ROS Review of Systems  Constitutional: Positive for fatigue. Negative for chills and fever.  HENT: Positive for hearing loss. Negative for congestion, ear discharge, ear pain, postnasal drip, sinus pressure and sore throat.        Bilat cerumen impaction- a little dizzy and muffled. Cleaned out last visit- thought it was better  Eyes: Negative.   Respiratory: Negative.   Cardiovascular: Negative for chest pain, palpitations and leg swelling.       Chronic tachy- normal at home  Gastrointestinal: Negative.   Endocrine: Negative.   Genitourinary: Negative.     Musculoskeletal: Negative.   Allergic/Immunologic: Negative.   Neurological: Negative.   Psychiatric/Behavioral: Negative.       Objective:    Physical Exam Vitals reviewed.  Constitutional:      Appearance: She is normal weight.  HENT:     Head: Normocephalic.     Right Ear: There is impacted cerumen.     Left Ear: There is impacted cerumen.  Eyes:     Pupils: Pupils are equal, round, and reactive to light.  Cardiovascular:     Rate and Rhythm: Normal rate and regular rhythm.     Pulses: Normal pulses.     Heart sounds: Normal heart sounds.  Pulmonary:     Effort: Pulmonary effort is normal.     Breath sounds: Normal breath sounds.  Abdominal:     Palpations: Abdomen is soft.     Tenderness: There is no abdominal tenderness.  Musculoskeletal:        General: Normal range of motion.     Cervical back: Normal range of motion.  Lymphadenopathy:     Cervical: No cervical adenopathy.  Skin:    General: Skin is warm and dry.  Neurological:     General: No focal deficit present.     Mental Status: She is alert and oriented to person, place, and time.  Psychiatric:        Mood and Affect: Mood normal.        Behavior: Behavior normal.        Thought Content: Thought content normal.        Judgment: Judgment normal.     BP 110/72 (BP Location: Left Arm, Patient Position: Sitting, Cuff Size: Normal)   Pulse 99   Temp 98.3 F (36.8 C) (Oral)   Ht 5\' 4"  (1.626 m)   Wt 143 lb (64.9 kg)   SpO2 99%   BMI 24.55 kg/m  Wt Readings from Last 3 Encounters:  02/29/20 143 lb (64.9 kg)  11/03/19 147 lb 6.4 oz (66.9 kg)  10/04/19 151 lb (68.5 kg)     Health Maintenance Due  Topic Date Due  . Hepatitis C Screening  Never done  . COVID-19 Vaccine (1) Never done  . HIV Screening  Never done  . INFLUENZA VACCINE  02/13/2020    There are no preventive care reminders to display for this patient.  Lab Results  Component Value Date   TSH 3.33 09/20/2019   Lab Results   Component Value Date   WBC 5.0 12/28/2019   HGB 13.1 12/28/2019   HCT 38.5 12/28/2019   MCV 84.0 12/28/2019   PLT 194.0 12/28/2019   Lab Results  Component Value Date   NA 138 09/20/2019  K 4.0 09/20/2019   CO2 25 09/20/2019   GLUCOSE 103 (H) 09/20/2019   BUN 13 09/20/2019   CREATININE 0.86 09/20/2019   BILITOT 0.3 09/20/2019   ALKPHOS 55 09/20/2019   AST 18 09/20/2019   ALT 12 09/20/2019   PROT 7.6 09/20/2019   ALBUMIN 4.5 09/20/2019   CALCIUM 9.6 09/20/2019   GFR 76.35 09/20/2019   Lab Results  Component Value Date   CHOL 176 09/20/2019   Lab Results  Component Value Date   HDL 64.60 09/20/2019   Lab Results  Component Value Date   LDLCALC 91 09/20/2019   Lab Results  Component Value Date   TRIG 102.0 09/20/2019   Lab Results  Component Value Date   CHOLHDL 3 09/20/2019   Lab Results  Component Value Date   HGBA1C 4.8 02/29/2020      Assessment & Plan:   Problem List Items Addressed This Visit      Other   IDA (iron deficiency anemia)   Relevant Orders   IBC + Ferritin (Completed)   Fatigue   Relevant Orders   B12 and Folate Panel (Completed)   Hemoglobin A1c (Completed)   Low vitamin D level   Relevant Orders   VITAMIN D 25 Hydroxy (Vit-D Deficiency, Fractures) (Completed)   Preventative health care   Relevant Orders   Hemoglobin A1c (Completed)    Other Visit Diagnoses    Bilateral impacted cerumen    -  Primary   Relevant Orders   Ambulatory referral to ENT      No orders of the defined types were placed in this encounter.  Labs today for follow-up on iron panel, B12, vitamin D, 24-month average blood sugar.  Instructions regarding vitamin D supplementation, iron supplementation pending results.   I put a referral to ENT to help you get your earwax removed.  Dr. Jenne Campus can also check to the lymph node concerns.  Debrox - over the counter weekly once cerumen is resolved. ENT may have other recs.  Follow-up: Return in about 1  year (around 02/28/2021).   This visit occurred during the SARS-CoV-2 public health emergency.  Safety protocols were in place, including screening questions prior to the visit, additional usage of staff PPE, and extensive cleaning of exam room while observing appropriate contact time as indicated for disinfecting solutions.   Amedeo Kinsman, NP

## 2020-03-02 ENCOUNTER — Encounter: Payer: Self-pay | Admitting: Nurse Practitioner

## 2020-03-06 DIAGNOSIS — H6123 Impacted cerumen, bilateral: Secondary | ICD-10-CM | POA: Diagnosis not present

## 2020-03-07 ENCOUNTER — Telehealth: Payer: Self-pay | Admitting: Nurse Practitioner

## 2020-03-07 NOTE — Telephone Encounter (Signed)
"  Pt aware she needs to contact the financial dept before sched'ing" [facility] [place] said 7 days ago

## 2020-11-06 ENCOUNTER — Other Ambulatory Visit: Payer: Self-pay

## 2020-11-06 ENCOUNTER — Encounter: Payer: Self-pay | Admitting: Obstetrics and Gynecology

## 2020-11-06 ENCOUNTER — Ambulatory Visit (INDEPENDENT_AMBULATORY_CARE_PROVIDER_SITE_OTHER): Payer: BC Managed Care – PPO | Admitting: Obstetrics and Gynecology

## 2020-11-06 VITALS — BP 122/74 | Ht 64.0 in | Wt 143.0 lb

## 2020-11-06 DIAGNOSIS — R5383 Other fatigue: Secondary | ICD-10-CM

## 2020-11-06 DIAGNOSIS — Z1331 Encounter for screening for depression: Secondary | ICD-10-CM

## 2020-11-06 DIAGNOSIS — Z01419 Encounter for gynecological examination (general) (routine) without abnormal findings: Secondary | ICD-10-CM | POA: Diagnosis not present

## 2020-11-06 DIAGNOSIS — Z1339 Encounter for screening examination for other mental health and behavioral disorders: Secondary | ICD-10-CM

## 2020-11-06 DIAGNOSIS — D500 Iron deficiency anemia secondary to blood loss (chronic): Secondary | ICD-10-CM

## 2020-11-06 NOTE — Progress Notes (Addendum)
Gynecology Annual Exam  PCP: Theadore Nan, NP  Chief Complaint  Patient presents with  . Annual Exam    History of Present Illness:  Denise Cox is a 35 y.o. G1P1001 who LMP was Patient's last menstrual period was 10/23/2020., presents today for her annual examination.  Her menses are regular every 28-30 days, lasting 7 day(s).  Dysmenorrhea none. She does not have intermenstrual bleeding.  She is sexually active. She uses condoms for contraception..  Last Pap: 09/2019  Results were: no abnormalities /neg HPV DNA negative Hx of STDs: none  There is no FH of breast cancer. There is no FH of ovarian cancer. The patient does do self-breast exams.  Tobacco use: The patient denies current or previous tobacco use. Alcohol use: social drinker Exercise: moderate. Trying to be more consistent.   She would like her iron levels checked again.  Her last check was normal.  Her energy is still fairly good at this point. She is taking iron with semi-regularity.    The patient wears seatbelts: yes.   The patient reports that domestic violence in her life is absent.   Past Medical History:  Diagnosis Date  . Anemia 2077m   With pregnancy Hgb 11.7    Past Surgical History:  Procedure Laterality Date  . ANKLE SURGERY Right 1998  . SINUSOTOMY  2001  . WISDOM TOOTH EXTRACTION  2000    Prior to Admission medications   Medication Sig Start Date End Date Taking? Authorizing Provider  ferrous sulfate 325 (65 FE) MG tablet Take 325 mg by mouth daily with breakfast.   Yes [provider]    Allergies  Allergen Reactions  . Augmentin [Amoxicillin-Pot Clavulanate] Diarrhea  . Avelox [Moxifloxacin] Other (See Comments)    Neuropathy  . Septra [Sulfamethoxazole-Trimethoprim] Diarrhea    Obstetric History: G1P1001, SVD x 1 with 2nd degree laceration  Social History   Socioeconomic History  . Marital status: Married    Spouse name: Not on file  . Number of children: Not  on file  . Years of education: Not on file  . Highest education level: Not on file  Occupational History  . Not on file  Tobacco Use  . Smoking status: Never Smoker  . Smokeless tobacco: Never Used  Vaping Use  . Vaping Use: Never used  Substance and Sexual Activity  . Alcohol use: Yes    Comment: ocassionally  . Drug use: Not Currently  . Sexual activity: Yes    Birth control/protection: None  Other Topics Concern  . Not on file  Social History Narrative  . Not on file   Social Determinants of Health   Financial Resource Strain: Not on file  Food Insecurity: Not on file  Transportation Needs: Not on file  Physical Activity: Not on file  Stress: Not on file  Social Connections: Not on file  Intimate Partner Violence: Not on file    Family History  Problem Relation Age of Onset  . Hypertension Maternal Grandmother   . Hypertension Mother     Review of Systems  Constitutional: Negative.   HENT: Negative.   Eyes: Negative.   Respiratory: Negative.   Cardiovascular: Negative.   Gastrointestinal: Negative.   Genitourinary: Negative.   Musculoskeletal: Negative.   Skin: Negative.   Neurological: Negative.   Psychiatric/Behavioral: Negative.      Physical Exam BP 122/74   Ht 5\' 4"  (1.626 m)   Wt 143 lb (64.9 kg)   LMP 10/23/2020  BMI 24.55 kg/m   Physical Exam Constitutional:      General: She is not in acute distress.    Appearance: Normal appearance. She is well-developed.  Genitourinary:     Vulva and bladder normal.     Right Labia: No rash, tenderness, lesions, skin changes or Bartholin's cyst.    Left Labia: No tenderness, lesions, skin changes, Bartholin's cyst or rash.    No inguinal adenopathy present in the right or left side.    Pelvic Tanner Score: 5/5.    No vaginal discharge, erythema, tenderness or bleeding.      Right Adnexa: not tender, not full and no mass present.    Left Adnexa: not tender, not full and no mass present.    No  cervical motion tenderness, discharge, lesion or polyp.     Uterus is not enlarged or tender.     No uterine mass detected.    Pelvic exam was performed with patient in the lithotomy position.  Breasts:     Right: No inverted nipple, mass, nipple discharge, skin change or tenderness.     Left: No inverted nipple, mass, nipple discharge, skin change or tenderness.    HENT:     Head: Normocephalic and atraumatic.  Eyes:     General: No scleral icterus.    Conjunctiva/sclera: Conjunctivae normal.  Neck:     Thyroid: No thyromegaly.  Cardiovascular:     Rate and Rhythm: Normal rate and regular rhythm.     Heart sounds: No murmur heard. No friction rub. No gallop.   Pulmonary:     Effort: Pulmonary effort is normal. No respiratory distress.     Breath sounds: Normal breath sounds. No wheezing or rales.  Abdominal:     General: Bowel sounds are normal. There is no distension.     Palpations: Abdomen is soft. There is no mass.     Tenderness: There is no abdominal tenderness. There is no guarding or rebound.     Hernia: There is no hernia in the left inguinal area or right inguinal area.  Musculoskeletal:        General: No swelling or tenderness. Normal range of motion.     Cervical back: Normal range of motion and neck supple.  Lymphadenopathy:     Cervical: No cervical adenopathy.     Lower Body: No right inguinal adenopathy. No left inguinal adenopathy.  Neurological:     General: No focal deficit present.     Mental Status: She is alert and oriented to person, place, and time.     Cranial Nerves: No cranial nerve deficit.  Skin:    General: Skin is warm and dry.     Findings: No erythema or rash.  Psychiatric:        Mood and Affect: Mood normal.        Behavior: Behavior normal.        Judgment: Judgment normal.    Female chaperone present for pelvic and breast  portions of the physical exam  Results: AUDIT Questionnaire (screen for alcoholism): 2 PHQ-9:  3  Assessment: 35 y.o. G27P1001 female here for routine annual gynecologic examination  Plan: Problem List Items Addressed This Visit      Other   IDA (iron deficiency anemia) - Primary   Relevant Orders   CBC with Differential/Platelet   Iron   Iron Binding Cap (TIBC)   Ferritin   Fatigue   Relevant Orders   B12 and Folate Panel   CBC with  Differential/Platelet   Iron   Iron Binding Cap (TIBC)   Ferritin    Other Visit Diagnoses    Screening for depression       Screening for alcoholism       Women's annual routine gynecological examination       Relevant Orders   B12 and Folate Panel   CBC with Differential/Platelet     Screening: -- Blood pressure screen normal -- Weight screening: normal -- Depression screening negative (PHQ-9) -- Nutrition: normal -- cholesterol screening: not due for screening -- osteoporosis screening: not due -- tobacco screening: not using -- alcohol screening: AUDIT questionnaire indicates low-risk usage. -- family history of breast cancer screening: done. not at high risk. -- no evidence of domestic violence or intimate partner violence. -- STD screening: gonorrhea/chlamydia NAAT not collected per patient request. -- pap smear not collected per ASCCP guidelines   Thomasene Mohair, MD 11/06/2020 9:12 AM

## 2020-11-06 NOTE — Addendum Note (Signed)
Addended by: Thomasene Mohair D on: 11/06/2020 09:12 AM   Modules accepted: Orders

## 2020-11-07 LAB — CBC WITH DIFFERENTIAL/PLATELET
Basophils Absolute: 0 10*3/uL (ref 0.0–0.2)
Basos: 1 %
EOS (ABSOLUTE): 0.1 10*3/uL (ref 0.0–0.4)
Eos: 2 %
Hematocrit: 39.3 % (ref 34.0–46.6)
Hemoglobin: 13.1 g/dL (ref 11.1–15.9)
Immature Grans (Abs): 0 10*3/uL (ref 0.0–0.1)
Immature Granulocytes: 0 %
Lymphocytes Absolute: 1.1 10*3/uL (ref 0.7–3.1)
Lymphs: 27 %
MCH: 29.6 pg (ref 26.6–33.0)
MCHC: 33.3 g/dL (ref 31.5–35.7)
MCV: 89 fL (ref 79–97)
Monocytes Absolute: 0.2 10*3/uL (ref 0.1–0.9)
Monocytes: 6 %
Neutrophils Absolute: 2.7 10*3/uL (ref 1.4–7.0)
Neutrophils: 64 %
Platelets: 166 10*3/uL (ref 150–450)
RBC: 4.43 x10E6/uL (ref 3.77–5.28)
RDW: 12.4 % (ref 11.7–15.4)
WBC: 4.1 10*3/uL (ref 3.4–10.8)

## 2020-11-07 LAB — B12 AND FOLATE PANEL
Folate: 17.8 ng/mL (ref >3.0)
Vitamin B-12: 517 pg/mL (ref 232–1245)

## 2020-11-07 LAB — FERRITIN: Ferritin: 21 ng/mL (ref 15–150)

## 2020-11-07 LAB — IRON AND TIBC
Iron Saturation: 22 % (ref 15–55)
Iron: 67 ug/dL (ref 27–159)
Total Iron Binding Capacity: 309 ug/dL (ref 250–450)
UIBC: 242 ug/dL (ref 131–425)

## 2021-01-04 ENCOUNTER — Encounter: Payer: BC Managed Care – PPO | Admitting: Adult Health

## 2021-04-16 ENCOUNTER — Other Ambulatory Visit: Payer: Self-pay

## 2021-04-16 ENCOUNTER — Encounter: Payer: Self-pay | Admitting: Registered Nurse

## 2021-04-16 ENCOUNTER — Telehealth (INDEPENDENT_AMBULATORY_CARE_PROVIDER_SITE_OTHER): Payer: BC Managed Care – PPO | Admitting: Registered Nurse

## 2021-04-16 DIAGNOSIS — B9689 Other specified bacterial agents as the cause of diseases classified elsewhere: Secondary | ICD-10-CM

## 2021-04-16 DIAGNOSIS — J329 Chronic sinusitis, unspecified: Secondary | ICD-10-CM

## 2021-04-16 MED ORDER — AZITHROMYCIN 250 MG PO TABS: ORAL_TABLET | ORAL | 0 refills | Status: AC

## 2021-04-16 MED ORDER — PREDNISONE 10 MG (21) PO TBPK: ORAL_TABLET | ORAL | 0 refills | Status: DC

## 2021-04-16 NOTE — Progress Notes (Signed)
Telemedicine Encounter- SOAP NOTE Established Patient  This video encounter was conducted with the patient's (or proxy's) verbal consent via audio telecommunications: yes/no: Yes Patient was instructed to have this encounter in a suitably private space; and to only have persons present to whom they give permission to participate. In addition, patient identity was confirmed by use of name plus two identifiers (DOB and address).  I discussed the limitations, risks, security and privacy concerns of performing an evaluation and management service by telephone and the availability of in person appointments. I also discussed with the patient that there may be a patient responsible charge related to this service. The patient expressed understanding and agreed to proceed.  I spent a total of 16 minutes talking with the patient or their proxy.  Patient at home Provider in office  Participants: Jari Sportsman, NP and Orson Ape  Chief Complaint  Patient presents with   Sinusitis    Patient states she has been have been experiencing some sinus pressure for about 2 days and she has been taking Ibuprofen. Patient has already had a cough/cold for about 1 week.    Subjective   Denise Cox is a 35 y.o. established patient. Video visit today for sinus   HPI Has 35 year old who has been sick She has had symptoms for about 1 week Sinus pressure, pain in maxillary and frontal sinuses Worse in morning. Pain is better with ibuprofen Pressure and fullness in ear. 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 Do not skip doses. Finish entire supply. 1 each 0   ferrous sulfate 325 (65 FE) MG tablet Take 325 mg by mouth daily with breakfast.     No current facility-administered medications for this visit.    Allergies  Allergen Reactions   Augmentin [Amoxicillin-Pot Clavulanate] Diarrhea   Avelox [Moxifloxacin] Other (See Comments)    Neuropathy   Septra [Sulfamethoxazole-Trimethoprim] Diarrhea    Social History   Socioeconomic History   Marital status: Married    Spouse name: Not on file   Number of children: Not on file   Years of education: Not on file   Highest education level: Not on file  Occupational History   Not on file  Tobacco Use   Smoking status: Never   Smokeless tobacco: Never  Vaping Use   Vaping Use: Never used  Substance and Sexual Activity   Alcohol use: Yes  Comment: ocassionally   Drug use: Not Currently   Sexual activity: Yes    Birth control/protection: None  Other Topics Concern   Not on file  Social History Narrative   Not on file   Social Determinants of Health   Financial Resource Strain: Not on file  Food Insecurity: Not on file  Transportation Needs: Not on file  Physical Activity: Not on file  Stress: Not on file  Social Connections: Not on file  Intimate Partner Violence: Not on file    ROS Per hpi   Objective    Vitals as reported by the patient: There were no vitals filed for this visit.  Prisca was seen today for sinusitis.  Diagnoses and all orders for this visit:  Bacterial sinusitis -     azithromycin (ZITHROMAX) 250 MG tablet; Take 2 tablets on day 1, then 1 tablet daily on days 2 through 5 -     predniSONE (STERAPRED UNI-PAK 21 TAB) 10 MG (21) TBPK tablet; Take per package instructions. Do not skip doses. Finish entire supply.   PLAN Z pack and prednisone as above Return if worsening or failing to improve Discussed nonpharm with patient who voices understanding. Patient encouraged to call clinic with any questions, comments, or concerns.   I discussed the assessment and treatment plan with the patient. The patient was provided an opportunity to ask questions and all were answered. The patient agreed with the plan and demonstrated an understanding of the instructions.   The patient was advised to call back or seek an in-person evaluation if the symptoms worsen or if the condition fails to improve as anticipated.  I provided 16 minutes of face-to-face time during this encounter.  Janeece Agee, NP

## 2021-04-16 NOTE — Patient Instructions (Signed)
° ° ° °  If you have lab work done today you will be contacted with your lab results within the next 2 weeks.  If you have not heard from us then please contact us. The fastest way to get your results is to register for My Chart. ° ° °IF you received an x-ray today, you will receive an invoice from [facility]. Please contact [facility] at [phone] with questions or concerns regarding your invoice.  ° °IF you received labwork today, you will receive an invoice from [facility]. Please contact [facility] at [phone] with questions or concerns regarding your invoice.  ° °Our billing staff will not be able to assist you with questions regarding bills from these companies. ° °You will be contacted with the lab results as soon as they are available. The fastest way to get your results is to activate your My Chart account. Instructions are located on the last page of this paperwork. If you have not heard from us regarding the results in 2 weeks, please contact this office. °  ° ° ° °

## 2021-11-01 ENCOUNTER — Ambulatory Visit (INDEPENDENT_AMBULATORY_CARE_PROVIDER_SITE_OTHER): Payer: BC Managed Care – PPO | Admitting: Family

## 2021-11-01 ENCOUNTER — Encounter: Payer: Self-pay | Admitting: Family

## 2021-11-01 VITALS — BP 115/64 | HR 92 | Temp 98.4°F | Resp 16 | Ht 64.0 in | Wt 145.1 lb

## 2021-11-01 DIAGNOSIS — D509 Iron deficiency anemia, unspecified: Secondary | ICD-10-CM | POA: Diagnosis not present

## 2021-11-01 DIAGNOSIS — Z13220 Encounter for screening for lipoid disorders: Secondary | ICD-10-CM | POA: Diagnosis not present

## 2021-11-01 DIAGNOSIS — Z0000 Encounter for general adult medical examination without abnormal findings: Secondary | ICD-10-CM | POA: Diagnosis not present

## 2021-11-01 DIAGNOSIS — E559 Vitamin D deficiency, unspecified: Secondary | ICD-10-CM | POA: Insufficient documentation

## 2021-11-01 DIAGNOSIS — Z0001 Encounter for general adult medical examination with abnormal findings: Secondary | ICD-10-CM | POA: Insufficient documentation

## 2021-11-01 LAB — CBC WITH DIFFERENTIAL/PLATELET
Basophils Absolute: 0 10*3/uL (ref 0.0–0.1)
Basophils Relative: 0.7 % (ref 0.0–3.0)
Eosinophils Absolute: 0.1 10*3/uL (ref 0.0–0.7)
Eosinophils Relative: 1.3 % (ref 0.0–5.0)
HCT: 34.7 % — ABNORMAL LOW (ref 36.0–46.0)
Hemoglobin: 11.2 g/dL — ABNORMAL LOW (ref 12.0–15.0)
Lymphocytes Relative: 23.2 % (ref 12.0–46.0)
Lymphs Abs: 1.1 10*3/uL (ref 0.7–4.0)
MCHC: 32.2 g/dL (ref 30.0–36.0)
MCV: 74.6 fl — ABNORMAL LOW (ref 78.0–100.0)
Monocytes Absolute: 0.3 10*3/uL (ref 0.1–1.0)
Monocytes Relative: 6 % (ref 3.0–12.0)
Neutro Abs: 3.4 10*3/uL (ref 1.4–7.7)
Neutrophils Relative %: 68.8 % (ref 43.0–77.0)
Platelets: 219 10*3/uL (ref 150.0–400.0)
RBC: 4.65 Mil/uL (ref 3.87–5.11)
RDW: 15.1 % (ref 11.5–15.5)
WBC: 4.9 10*3/uL (ref 4.0–10.5)

## 2021-11-01 LAB — IBC + FERRITIN
Ferritin: 3.1 ng/mL — ABNORMAL LOW (ref 10.0–291.0)
Iron: 41 ug/dL — ABNORMAL LOW (ref 42–145)
Saturation Ratios: 9 % — ABNORMAL LOW (ref 20.0–50.0)
TIBC: 457.8 ug/dL — ABNORMAL HIGH (ref 250.0–450.0)
Transferrin: 327 mg/dL (ref 212.0–360.0)

## 2021-11-01 LAB — LIPID PANEL
Cholesterol: 189 mg/dL (ref 0–200)
HDL: 73.8 mg/dL (ref >39.00)
LDL Cholesterol: 93 mg/dL (ref 0–99)
NonHDL: 115.66
Total CHOL/HDL Ratio: 3
Triglycerides: 112 mg/dL (ref 0.0–149.0)
VLDL: 22.4 mg/dL (ref 0.0–40.0)

## 2021-11-01 LAB — COMPREHENSIVE METABOLIC PANEL
ALT: 12 U/L (ref 0–35)
AST: 18 U/L (ref 0–37)
Albumin: 4.6 g/dL (ref 3.5–5.2)
Alkaline Phosphatase: 48 U/L (ref 39–117)
BUN: 16 mg/dL (ref 6–23)
CO2: 27 mEq/L (ref 19–32)
Calcium: 9.4 mg/dL (ref 8.4–10.5)
Chloride: 104 mEq/L (ref 96–112)
Creatinine, Ser: 0.87 mg/dL (ref 0.40–1.20)
GFR: 86.96 mL/min (ref >60.00)
Glucose, Bld: 80 mg/dL (ref 70–99)
Potassium: 4.7 mEq/L (ref 3.5–5.1)
Sodium: 139 mEq/L (ref 135–145)
Total Bilirubin: 0.4 mg/dL (ref 0.2–1.2)
Total Protein: 7.2 g/dL (ref 6.0–8.3)

## 2021-11-01 LAB — VITAMIN D 25 HYDROXY (VIT D DEFICIENCY, FRACTURES): VITD: 25.49 ng/mL — ABNORMAL LOW (ref 30.00–100.00)

## 2021-11-01 NOTE — Assessment & Plan Note (Signed)
Ordered vitamin d pending results.   

## 2021-11-01 NOTE — Assessment & Plan Note (Signed)
Lipid panel ordered pending results.   

## 2021-11-01 NOTE — Assessment & Plan Note (Signed)
Order cbc ibc ferritin  ?Pending results ?

## 2021-11-01 NOTE — Progress Notes (Signed)
? ?Established Patient Office Visit ? ?Subjective:  ?Patient ID: Denise Cox, female    DOB: 05-21-1986  Age: 36 y.o. MRN: [id number] ? ?CC:  ?Chief Complaint  ?Patient presents with  ? Transitions Of Care  ? Anemia  ? ? ?HPI ?Denise Cox is here for a transition of care visit. ? ?Prior provider was: Denice Paradise, NP  ?Pt is without acute concerns.  ? ?chronic concerns: ? ?IDA: was put on iron for a year in the past, had OB check her iron and was told to stop iron. She does want to recheck the levels again. Denies fatigue. No sob. Menses are heavy, occur monthly pretty regularly, last for about 5-7 days. Not overly painful, mild cramping prior.  ? ?Migraines: not since she had sinus surgery/ hemiplegic migraine in 2007. Has not had migraine since. ? ?Pap: last pap was 10/04/19, due 10/04/22   ? ?Past Medical History:  ?Diagnosis Date  ? Anemia 2054m  ? With pregnancy Hgb 11.7  ? 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 Formatting of this note might be different from the original. Reactive HIV ab/ag, negative for RNA. False positive HIV result.  ? Migraines 12/15/2014  ? Formatting of this note might be different from the original. History of neuro deficit(hemiplegia)but migraines resolved with sinus surgery.  Last Assessment & Plan:  Formatting of this note might be different from the original. Had hemiplegic migraine syndrome with hospitilazation in 2007; no problems since then; uses ibuprofen PRN.  ? ? ?Past Surgical History:  ?Procedure Laterality Date  ? ANKLE SURGERY Right 1998  ? SINUSOTOMY  2007  ? [place] EXTRACTION  2000  ? ? ?Family History  ?Problem Relation Age of Onset  ? Hypertension Mother   ? Hypertension Maternal Grandmother   ? Stomach cancer Maternal Grandmother   ? Skin cancer Maternal Grandmother   ? Anxiety disorder Paternal Grandmother   ? Kidney cancer Paternal Grandmother   ? ? ?Social History  ? ?Socioeconomic History  ? Marital status: Married  ?  Spouse name: Not on file  ?  Number of children: Not on file  ? Years of education: Not on file  ? Highest education level: Not on file  ?Occupational History  ?  Employer: [employer]  ? Occupation: recruiting for [place]  ?Tobacco Use  ? Smoking status: Never  ? Smokeless tobacco: Never  ?Vaping Use  ? Vaping Use: Never used  ?Substance and Sexual Activity  ? Alcohol use: Yes  ?  Comment: ocassionally  ? Drug use: Never  ? Sexual activity: Yes  ?  Partners: Male  ?  Birth control/protection: Condom  ?Other Topics Concern  ? Not on file  ?Social History Narrative  ? Not on file  ? ?Social Determinants of Health  ? ?Financial Resource Strain: Not on file  ?Food Insecurity: Not on file  ?Transportation Needs: Not on file  ?Physical Activity: Not on file  ?Stress: Not on file  ?Social Connections: Not on file  ?Intimate Partner Violence: Not on file  ? ? ?Outpatient Medications Prior to Visit  ?Medication Sig Dispense Refill  ? ferrous sulfate 325 (65 FE) MG tablet Take 325 mg by mouth daily with breakfast.    ? predniSONE (STERAPRED UNI-PAK 21 TAB) 10 MG (21) TBPK tablet Take per package instructions. Do not skip doses. Finish entire supply. 1 each 0  ? ?No facility-administered medications prior to visit.  ? ? ?Allergies  ?Allergen Reactions  ?  Augmentin [Amoxicillin-Pot Clavulanate] Diarrhea  ? Avelox [Moxifloxacin] Other (See Comments)  ?  Neuropathy  ? Septra [Sulfamethoxazole-Trimethoprim] Diarrhea  ? ? ?ROS ?Review of Systems  ?Constitutional:  Negative for chills and fatigue.  ?Respiratory:  Negative for cough and shortness of breath.   ?Cardiovascular:  Negative for chest pain and leg swelling.  ?Gastrointestinal:  Negative for diarrhea and nausea.  ?Genitourinary:  Negative for difficulty urinating.  ?Psychiatric/Behavioral:  Negative for agitation and sleep disturbance.   ?All other systems reviewed and are negative. ? ? ? ?  ?Objective:  ?  ?Physical Exam ?Vitals reviewed.  ?Constitutional:   ?   General: She is  not in acute distress. ?   Appearance: Normal appearance. She is not ill-appearing or toxic-appearing.  ?HENT:  ?   Right Ear: Tympanic membrane normal.  ?   Left Ear: Tympanic membrane normal.  ?   Mouth/Throat:  ?   Mouth: Mucous membranes are moist.  ?   Pharynx: No pharyngeal swelling.  ?   Tonsils: No tonsillar exudate.  ?Eyes:  ?   Extraocular Movements: Extraocular movements intact.  ?   Conjunctiva/sclera: Conjunctivae normal.  ?   Pupils: Pupils are equal, round, and reactive to light.  ?Neck:  ?   Thyroid: No thyroid mass.  ?Cardiovascular:  ?   Rate and Rhythm: Normal rate and regular rhythm.  ?Pulmonary:  ?   Effort: Pulmonary effort is normal.  ?   Breath sounds: Normal breath sounds.  ?Abdominal:  ?   General: Abdomen is flat. Bowel sounds are normal.  ?   Palpations: Abdomen is soft.  ?Musculoskeletal:     ?   General: Normal range of motion.  ?Lymphadenopathy:  ?   Cervical:  ?   Right cervical: No superficial cervical adenopathy. ?   Left cervical: No superficial cervical adenopathy.  ?Skin: ?   General: Skin is warm.  ?   Capillary Refill: Capillary refill takes less than 2 seconds.  ?Neurological:  ?   General: No focal deficit present.  ?   Mental Status: She is alert and oriented to person, place, and time.  ?Psychiatric:     ?   Mood and Affect: Mood normal.     ?   Behavior: Behavior normal.     ?   Thought Content: Thought content normal.     ?   Judgment: Judgment normal.  ? ? ? ? ?BP 115/64   Pulse 92   Temp 98.4 ?F (36.9 ?C)   Resp 16   Ht 5\' 4"  (1.626 m)   Wt 145 lb 2 oz (65.8 kg)   LMP 10/31/2021   SpO2 99%   BMI 24.91 kg/m?  ?Wt Readings from Last 3 Encounters:  ?11/01/21 145 lb 2 oz (65.8 kg)  ?11/06/20 143 lb (64.9 kg)  ?02/29/20 143 lb (64.9 kg)  ? ? ? ?There are no preventive care reminders to display for this patient. ? ? ?There are no preventive care reminders to display for this patient. ? ?Lab Results  ?Component Value Date  ? TSH 3.33 09/20/2019  ? ?Lab Results   ?Component Value Date  ? WBC 4.1 11/06/2020  ? HGB 13.1 11/06/2020  ? HCT 39.3 11/06/2020  ? MCV 89 11/06/2020  ? PLT 166 11/06/2020  ? ?Lab Results  ?Component Value Date  ? NA 138 09/20/2019  ? K 4.0 09/20/2019  ? CO2 25 09/20/2019  ? GLUCOSE 103 (H) 09/20/2019  ? BUN 13 09/20/2019  ? CREATININE  0.86 09/20/2019  ? BILITOT 0.3 09/20/2019  ? ALKPHOS 55 09/20/2019  ? AST 18 09/20/2019  ? ALT 12 09/20/2019  ? PROT 7.6 09/20/2019  ? ALBUMIN 4.5 09/20/2019  ? CALCIUM 9.6 09/20/2019  ? GFR 76.35 09/20/2019  ? ?Lab Results  ?Component Value Date  ? CHOL 176 09/20/2019  ? ?Lab Results  ?Component Value Date  ? HDL 64.60 09/20/2019  ? ?Lab Results  ?Component Value Date  ? [place] 91 09/20/2019  ? ?Lab Results  ?Component Value Date  ? TRIG 102.0 09/20/2019  ? ?Lab Results  ?Component Value Date  ? CHOLHDL 3 09/20/2019  ? ?Lab Results  ?Component Value Date  ? HGBA1C 4.8 02/29/2020  ? ? ?  ?Assessment & Plan:  ? ?Problem List Items Addressed This Visit   ? ?  ? Other  ? IDA (iron deficiency anemia)  ?  Order cbc ibc ferritin  ?Pending results ? ?  ?  ? Relevant Orders  ? CBC with Differential/Platelet  ? IBC + Ferritin  ? Vitamin D deficiency - Primary  ?  Ordered vitamin d pending results.  ? ? ?  ?  ? Relevant Orders  ? VITAMIN D 25 Hydroxy (Vit-D Deficiency, Fractures)  ? Screening for lipoid disorders  ?  Lipid panel ordered pending results.  ? ? ?  ?  ? Relevant Orders  ? Lipid panel  ? Encounter for general adult medical examination without abnormal findings  ?  Patient Counseling(The following topics were reviewed): ? Preventative care handout given to pt  ?Health maintenance and immunizations reviewed. Please refer to Health maintenance section. ?Pt advised on safe sex, wearing seatbelts in car, and proper nutrition ?labwork ordered today for annual ?Dental health: Discussed importance of regular tooth brushing, flossing, and dental visits. ? ? ?  ?  ? Relevant Orders  ? Comprehensive metabolic panel  ? Lipid panel   ? CBC with Differential/Platelet  ? IBC + Ferritin  ? ? ?No orders of the defined types were placed in this encounter. ? ? ?Follow-up: Return in about 1 year (around 11/02/2022) for for annual physical or as needed .  ?

## 2021-11-01 NOTE — Assessment & Plan Note (Signed)
Patient Counseling(The following topics were reviewed): ? Preventative care handout given to pt  ?Health maintenance and immunizations reviewed. Please refer to Health maintenance section. ?Pt advised on safe sex, wearing seatbelts in car, and proper nutrition ?labwork ordered today for annual ?Dental health: Discussed importance of regular tooth brushing, flossing, and dental visits. ? ? ?

## 2021-11-01 NOTE — Patient Instructions (Signed)
Welcome to our clinic, I am happy to have you as my new patient. I am excited to continue on this healthcare journey with you. ? ?Stop by the lab prior to leaving today. I will notify you of your results once received.  ? ?Please keep in mind ?Any my chart messages you send have p to a three business day turnaround for a response.  ?Phone calls may have up to a one day business turnaround for a  response.  ? ?If you need a medication refill I recommend you request it through the pharmacy as this is easiest for us rather than sending a message and or phone call.  ? ?Due to recent changes in healthcare laws, you may see results of your imaging and/or laboratory studies on MyChart before I have had a chance to review them.  I understand that in some cases there may be results that are confusing or concerning to you. Please understand that not all results are received at the same time and often I may need to interpret multiple results in order to provide you with the best plan of care or course of treatment. Therefore, I ask that you please give me 2 business days to thoroughly review all your results before contacting my office for clarification. Should we see a critical lab result, you will be contacted sooner.  ? ?It was a pleasure seeing you today! Please do not hesitate to reach out with any questions and or concerns. ? ?Regards,  ? ?Tiffanyann Deroo ?FNP-C ? ?

## 2021-11-02 ENCOUNTER — Other Ambulatory Visit: Payer: Self-pay | Admitting: Family

## 2021-11-02 DIAGNOSIS — E559 Vitamin D deficiency, unspecified: Secondary | ICD-10-CM

## 2021-11-02 MED ORDER — VITAMIN D (ERGOCALCIFEROL) 1.25 MG (50000 UNIT) PO CAPS: 50000.0000 [IU] | ORAL_CAPSULE | ORAL | 0 refills | Status: AC

## 2022-02-26 ENCOUNTER — Telehealth: Payer: Self-pay

## 2022-02-26 ENCOUNTER — Other Ambulatory Visit: Payer: BC Managed Care – PPO

## 2022-02-26 NOTE — Telephone Encounter (Signed)
-----   Message from Mort Sawyers, [place] sent at 02/26/2022  7:31 AM EDT ----- Regarding: RE: Lab order for Tuesday, 8.15.23 Pt was supposed to make in office f/u in three months not just a lab order.  Please call pt to cancel lab appt and make in office f/u.  Ferritin was very low, I wanted to assess her.   ----- Message ----- From: Alvina Chou Sent: 02/25/2022   2:19 PM EDT To: Mort Sawyers, FNP Subject: Lab order for Tuesday, 8.15.23                 Lab order

## 2022-02-26 NOTE — Telephone Encounter (Signed)
Called and left a message for pt. Patient scheduled a lab visit on mychart and it needed to be a office visit.

## 2022-03-01 ENCOUNTER — Ambulatory Visit: Payer: BC Managed Care – PPO | Admitting: Family

## 2022-05-22 ENCOUNTER — Encounter: Payer: Self-pay | Admitting: Family

## 2022-05-22 ENCOUNTER — Telehealth (INDEPENDENT_AMBULATORY_CARE_PROVIDER_SITE_OTHER): Payer: Self-pay | Admitting: Family

## 2022-05-22 VITALS — Ht 64.0 in | Wt 145.0 lb

## 2022-05-22 DIAGNOSIS — J0110 Acute frontal sinusitis, unspecified: Secondary | ICD-10-CM | POA: Insufficient documentation

## 2022-05-22 MED ORDER — DOXYCYCLINE HYCLATE 100 MG PO TABS: 100.0000 mg | ORAL_TABLET | Freq: Two times a day (BID) | ORAL | 0 refills | Status: AC

## 2022-05-22 NOTE — Assessment & Plan Note (Signed)
RX doxycycline 100 mg po bid x 10 days Pt to continue tylenol/ibuprofen prn sinus pain. Continue with humidifier prn and steam showers recommended as well. instructed If no symptom improvement in 48 hours please f/u  

## 2022-05-22 NOTE — Progress Notes (Signed)
MyChart Video Visit    Virtual Visit via Video Note   This visit type was conducted due to national recommendations for restrictions regarding the COVID-19 Pandemic (e.g. social distancing) in an effort to limit this patient's exposure and mitigate transmission in our community. This patient is at least at moderate risk for complications without adequate follow up. This format is felt to be most appropriate for this patient at this time. Physical exam was limited by quality of the video and audio technology used for the visit. CMA was able to get the patient set up on a video visit.  Patient location: Home. Patient and provider in visit Provider location: Office  I discussed the limitations of evaluation and management by telemedicine and the availability of in person appointments. The patient expressed understanding and agreed to proceed.  Visit Date: 05/22/2022  Today's healthcare provider: Eugenia Pancoast, FNP     Subjective:    Patient ID: Denise Cox, female    DOB: 09/11/1985, 36 y.o.   MRN: [id number]  Chief Complaint  Patient presents with   Sinus Problem    X 1 week the last 2 days its has been painful have not took a covid test.    Sinus Problem    Pt here today via video visit with concerns.   Sinus pressure and mucous for over one week, and in the last two days pain in sinus that she is having to take ibuprofen to feel comfortable. Bil ear pressure, and no sore throat. She does feel swelling in her jaw and has teeth pain, right side in particular.   No fever or chills.  Slight dry non productive cough. No chest congestion.  Has not covid tested.   Past Medical History:  Diagnosis Date   Anemia 2075m   With pregnancy Hgb 11.7   False positive HIV serology 12/30/2016   Formatting of this note might be different from the original. Reactive HIV ab/ag, negative for RNA. False positive HIV result.   Migraines 12/15/2014   Formatting of this note might be  different from the original. History of neuro deficit(hemiplegia)but migraines resolved with sinus surgery.  Last Assessment & Plan:  Formatting of this note might be different from the original. Had hemiplegic migraine syndrome with hospitilazation in 2007; no problems since then; uses ibuprofen PRN.    Past Surgical History:  Procedure Laterality Date   ANKLE SURGERY Right 1998   SINUSOTOMY  2007   WISDOM TOOTH EXTRACTION  2000    Family History  Problem Relation Age of Onset   Hypertension Mother    Hypertension Maternal Grandmother    Stomach cancer Maternal Grandmother    Skin cancer Maternal Grandmother    Anxiety disorder Paternal Grandmother    Kidney cancer Paternal Grandmother     Social History   Socioeconomic History   Marital status: Married    Spouse name: Not on file   Number of children: Not on file   Years of education: Not on file   Highest education level: Not on file  Occupational History    Employer: Engineer, manufacturing   Occupation: recruiting for nannies  Tobacco Use   Smoking status: Never   Smokeless tobacco: Never  Vaping Use   Vaping Use: Never used  Substance and Sexual Activity   Alcohol use: Yes    Comment: ocassionally   Drug use: Never   Sexual activity: Yes    Partners: Male    Birth control/protection: Condom  Other Topics Concern   Not on file  Social History Narrative   Not on file   Social Determinants of Health   Financial Resource Strain: Not on file  Food Insecurity: Not on file  Transportation Needs: Not on file  Physical Activity: Not on file  Stress: Not on file  Social Connections: Not on file  Intimate Partner Violence: Not on file    Outpatient Medications Prior to Visit  Medication Sig Dispense Refill   Iron, Ferrous Sulfate, 325 (65 Fe) MG TABS      No facility-administered medications prior to visit.    Allergies  Allergen Reactions   Augmentin [Amoxicillin-Pot Clavulanate] Diarrhea    Avelox [Moxifloxacin] Other (See Comments)    Neuropathy   Septra [Sulfamethoxazole-Trimethoprim] Diarrhea    Review of Systems     Objective:    Physical Exam Constitutional:      General: She is not in acute distress.    Appearance: Normal appearance. She is not ill-appearing or toxic-appearing.  Pulmonary:     Effort: Pulmonary effort is normal.  Neurological:     General: No focal deficit present.     Mental Status: She is alert and oriented to person, place, and time. Mental status is at baseline.  Psychiatric:        Mood and Affect: Mood normal.        Behavior: Behavior normal.        Thought Content: Thought content normal.        Judgment: Judgment normal.     Ht 5\' 4"  (1.626 m)   Wt 145 lb (65.8 kg)   LMP 05/08/2022 (Approximate)   BMI 24.89 kg/m  Wt Readings from Last 3 Encounters:  05/22/22 145 lb (65.8 kg)  11/01/21 145 lb 2 oz (65.8 kg)  11/06/20 143 lb (64.9 kg)       Assessment & Plan:   Problem List Items Addressed This Visit       Respiratory   Acute non-recurrent frontal sinusitis - Primary    RX doxycycline 100 mg po bid x 10 days  Pt to continue tylenol/ibuprofen prn sinus pain. Continue with humidifier prn and steam showers recommended as well. instructed If no symptom improvement in 48 hours please f/u       Relevant Medications   doxycycline (VIBRA-TABS) 100 MG tablet    I am having Denise Cox start on doxycycline. I am also having her maintain her Iron (Ferrous Sulfate).  Meds ordered this encounter  Medications   doxycycline (VIBRA-TABS) 100 MG tablet    Sig: Take 1 tablet (100 mg total) by mouth 2 (two) times daily for 10 days.    Dispense:  20 tablet    Refill:  0    Order Specific Question:   Supervising Provider    Answer:   BEDSOLE, AMY E [[id number]]    I discussed the assessment and treatment plan with the patient. The patient was provided an opportunity to ask questions and all were answered. The patient agreed with  the plan and demonstrated an understanding of the instructions.   The patient was advised to call back or seek an in-person evaluation if the symptoms worsen or if the condition fails to improve as anticipated.  I provided 15 minutes of face-to-face time during this encounter.   11/08/20, FNP [facility] [place] [phone] (phone) [phone] (fax)  [facility]

## 2022-06-14 ENCOUNTER — Ambulatory Visit: Payer: Managed Care, Other (non HMO) | Admitting: Family

## 2022-06-14 ENCOUNTER — Encounter: Payer: Self-pay | Admitting: Family

## 2022-06-14 VITALS — BP 110/68 | HR 91 | Temp 98.2°F | Resp 16 | Ht 64.5 in | Wt 144.0 lb

## 2022-06-14 DIAGNOSIS — D509 Iron deficiency anemia, unspecified: Secondary | ICD-10-CM

## 2022-06-14 DIAGNOSIS — Z0001 Encounter for general adult medical examination with abnormal findings: Secondary | ICD-10-CM

## 2022-06-14 DIAGNOSIS — E559 Vitamin D deficiency, unspecified: Secondary | ICD-10-CM

## 2022-06-14 DIAGNOSIS — M79622 Pain in left upper arm: Secondary | ICD-10-CM

## 2022-06-14 DIAGNOSIS — N644 Mastodynia: Secondary | ICD-10-CM

## 2022-06-14 DIAGNOSIS — N6321 Unspecified lump in the left breast, upper outer quadrant: Secondary | ICD-10-CM

## 2022-06-14 LAB — CBC
HCT: 41.2 % (ref 36.0–46.0)
Hemoglobin: 14.2 g/dL (ref 12.0–15.0)
MCHC: 34.4 g/dL (ref 30.0–36.0)
MCV: 86.3 fl (ref 78.0–100.0)
Platelets: 204 10*3/uL (ref 150.0–400.0)
RBC: 4.78 Mil/uL (ref 3.87–5.11)
RDW: 13.3 % (ref 11.5–15.5)
WBC: 4.6 10*3/uL (ref 4.0–10.5)

## 2022-06-14 LAB — IBC + FERRITIN
Ferritin: 17.4 ng/mL (ref 10.0–291.0)
Iron: 136 ug/dL (ref 42–145)
Saturation Ratios: 36.8 % (ref 20.0–50.0)
TIBC: 369.6 ug/dL (ref 250.0–450.0)
Transferrin: 264 mg/dL (ref 212.0–360.0)

## 2022-06-14 LAB — VITAMIN D 25 HYDROXY (VIT D DEFICIENCY, FRACTURES): VITD: 37.26 ng/mL (ref 30.00–100.00)

## 2022-06-14 NOTE — Patient Instructions (Signed)
------------------------------------    I have sent an electronic order over to your preferred location for the following:   []   2D Mammogram  [x]   3D Mammogram  []   Bone Density   Please give this center a call to get scheduled at your convenience.  [x]   [facility]  [address] [place] [facility]  [phone]  Make sure to wear two piece  clothing  No lotions powders or deodorants the day of the appointment Make sure to bring picture ID and insurance card.  Bring list of medications you are currently taking including any supplements.    ------------------------------------   Stop by the lab prior to leaving today. I will notify you of your results once received.   Recommendations on keeping yourself healthy:  - Exercise at least 30-45 minutes a day, 3-4 days a week.  - Eat a low-fat diet with lots of fruits and vegetables, up to 7-9 servings per day.  - Seatbelts can save your life. Wear them always.  - Smoke detectors on every level of your home, check batteries every year.  - Eye Doctor - have an eye exam every 1-2 years  - Safe sex - if you may be exposed to STDs, use a condom.  - Alcohol -  If you drink, do it moderately, less than 2 drinks per day.  - Health Care Power of Attorney. Choose someone to speak for you if you are not able.  - Depression is common in our stressful world.If you're feeling down or losing interest in things you normally enjoy, please come in for a visit.  - Violence - If anyone is threatening or hurting you, please call immediately.  Due to recent changes in healthcare laws, you may see results of your imaging and/or laboratory studies on MyChart before I have had a chance to review them.  I understand that in some cases there may be results that are confusing or concerning to you. Please understand that not all results are received at the same time and often I may need to interpret  multiple results in order to provide you with the best plan of care or course of treatment. Therefore, I ask that you please give me 2 business days to thoroughly review all your results before contacting my office for clarification. Should we see a critical lab result, you will be contacted sooner.   I will see you again in one year for your annual comprehensive exam unless otherwise stated and or with acute concerns.  It was a pleasure seeing you today! Please do not hesitate to reach out with any questions and or concerns.  Regards,   [place]

## 2022-06-14 NOTE — Assessment & Plan Note (Signed)
Order cbc ibc ferritin pending results Continue daily iron supplementation

## 2022-06-14 NOTE — Assessment & Plan Note (Signed)
Dx mammogram and bil breast u/s ordered pending results.  Suspected breast densities, oil cyst or fibroadenoma.

## 2022-06-14 NOTE — Assessment & Plan Note (Signed)
Ordered vitamin d pending results.   

## 2022-06-14 NOTE — Assessment & Plan Note (Signed)
Patient Counseling(The following topics were reviewed): ? Preventative care handout given to pt  ?Health maintenance and immunizations reviewed. Please refer to Health maintenance section. ?Pt advised on safe sex, wearing seatbelts in car, and proper nutrition ?labwork ordered today for annual ?Dental health: Discussed importance of regular tooth brushing, flossing, and dental visits. ? ? ?

## 2022-06-14 NOTE — Progress Notes (Signed)
Established Patient Office Visit  Subjective:  Patient ID: Denise Cox, female    DOB: July 25, 1985  Age: 36 y.o. MRN: [id number]  CC:  Chief Complaint  Patient presents with   Annual Exam    HPI Denise Cox is here today for an well woman exam.   Pt is without acute concerns.   Last pap: 10/04/19 negative , due again 2024. Does see GYN.  Flu vaccine: pt declines   Left breast tenderness, last three weeks consistent. Not currently breastfeeding.  On the left axillary tail area. At times will also have tenderness inner quadrant left breast as well which she will at times feel with push ups. No nipple discharge and or rash on breasts.  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 Reactive HIV ab/ag, negative for RNA. False positive HIV result.   Migraines 12/15/2014   Formatting of this note might be different from the original. History of neuro deficit(hemiplegia)but migraines resolved with sinus surgery.  Last Assessment & Plan:  Formatting of this note might be different from the original. Had hemiplegic migraine syndrome with hospitilazation in 2007; no problems since then; uses ibuprofen PRN.    Past Surgical History:  Procedure Laterality Date   ANKLE SURGERY Right 1998   SINUSOTOMY  2007   WISDOM TOOTH EXTRACTION  2000    Family History  Problem Relation Age of Onset   Hypertension Mother    Hypertension Maternal Grandmother    Stomach cancer Maternal Grandmother    Skin cancer Maternal Grandmother    Anxiety disorder Paternal Grandmother    Kidney cancer Paternal Grandmother     Social History   Socioeconomic History   Marital status: Married    Spouse name: Not on file   Number of children: 1   Years of education: Not on file   Highest education level: Not on file  Occupational  History    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 [employer]   Occupation: recruiting for nannies  Tobacco Use   Smoking status: Never   Smokeless tobacco: Never  Vaping Use   Vaping Use: Never used  Substance and Sexual Activity   Alcohol use: Yes    Comment: ocassionally   Drug use: Never   Sexual activity: Yes    Partners: Male    Birth control/protection: Condom  Other Topics Concern   Not on file  Social History Narrative   73 year old son    Social Determinants of Health   Financial Resource Strain: Not on file  Food Insecurity: Not on file  Transportation Needs: Not on file  Physical Activity: Not on file  Stress: Not on file  Social Connections: Not on file  Intimate Partner Violence: Not on file    Outpatient Medications Prior to Visit  Medication Sig Dispense Refill   Iron, Ferrous Sulfate, 325 (65 Fe) MG TABS      No facility-administered medications prior to visit.    Allergies  Allergen Reactions   Augmentin [Amoxicillin-Pot Clavulanate] Diarrhea   Avelox [Moxifloxacin] Other (See Comments)    Neuropathy   Septra [Sulfamethoxazole-Trimethoprim] Diarrhea    ROS Review of Systems  Review of Systems   Breasts: negative for nipple discharge, left breast outer quadrant pain and tenderness with mass palpated, no nipple changes, no breast rash.  Genitourinary:  Negative for decreased urine volume, difficulty urinating, dyspareunia, dysuria, flank pain, menstrual problem, pelvic pain, urgency, vaginal bleeding, vaginal discharge and vaginal pain.  Psychiatric/Behavioral:  Negative for depression and suicidal ideas.   All other systems reviewed and are negative.    Objective:    Physical Exam Vitals reviewed.  Constitutional:      General: She is not in acute distress.    Appearance: Normal appearance. She is not ill-appearing or toxic-appearing.  HENT:     Right Ear: Tympanic membrane normal.     Left Ear: Tympanic membrane normal.     Mouth/Throat:      Mouth: Mucous membranes are moist.     Pharynx: No pharyngeal swelling.     Tonsils: No tonsillar exudate.  Eyes:     Extraocular Movements: Extraocular movements intact.     Conjunctiva/sclera: Conjunctivae normal.     Pupils: Pupils are equal, round, and reactive to light.  Neck:     Thyroid: No thyroid mass.  Cardiovascular:     Rate and Rhythm: Normal rate and regular rhythm.  Pulmonary:     Effort: Pulmonary effort is normal.     Breath sounds: Normal breath sounds.  Chest:     Chest wall: Tenderness present.    Abdominal:     General: Abdomen is flat. Bowel sounds are normal.     Palpations: Abdomen is soft.  Musculoskeletal:        General: Normal range of motion.  Lymphadenopathy:     Cervical:     Right cervical: No superficial cervical adenopathy.    Left cervical: No superficial cervical adenopathy.  Skin:    General: Skin is warm.     Capillary Refill: Capillary refill takes less than 2 seconds.  Neurological:     General: No focal deficit present.     Mental Status: She is alert and oriented to person, place, and time.  Psychiatric:        Mood and Affect: Mood normal.        Behavior: Behavior normal.        Thought Content: Thought content normal.        Judgment: Judgment normal.      BP 110/68   Pulse 91   Temp 98.2 F (36.8 C)   Resp 16   Ht 5' 4.5" (1.638 m)   Wt 144 lb (65.3 kg)   LMP 05/08/2022 (Approximate)   SpO2 99%   BMI 24.34 kg/m  Wt Readings from Last 3 Encounters:  06/14/22 144 lb (65.3 kg)  05/22/22 145 lb (65.8 kg)  11/01/21 145 lb 2 oz (65.8 kg)     There are no preventive care reminders to display for this patient.  There are no preventive care reminders to display for this patient.  Lab Results  Component Value Date   TSH 3.33 09/20/2019   Lab Results  Component Value Date   WBC 4.9 11/01/2021   HGB 11.2 (L) 11/01/2021   HCT 34.7 (L) 11/01/2021   MCV 74.6 (L) 11/01/2021   PLT 219.0 11/01/2021   Lab Results   Component Value Date   NA 139 11/01/2021   K 4.7 11/01/2021   CO2 27 11/01/2021   GLUCOSE 80 11/01/2021   BUN 16 11/01/2021   CREATININE 0.87 11/01/2021   BILITOT 0.4 11/01/2021   ALKPHOS 48 11/01/2021   AST 18 11/01/2021   ALT 12 11/01/2021   PROT 7.2 11/01/2021   ALBUMIN 4.6 11/01/2021   CALCIUM 9.4  11/01/2021   GFR 86.96 11/01/2021   Lab Results  Component Value Date   CHOL 189 11/01/2021   Lab Results  Component Value Date   HDL 73.80 11/01/2021   Lab Results  Component Value Date   LDLCALC 93 11/01/2021   Lab Results  Component Value Date   TRIG 112.0 11/01/2021   Lab Results  Component Value Date   CHOLHDL 3 11/01/2021   Lab Results  Component Value Date   HGBA1C 4.8 02/29/2020      Assessment & Plan:   Problem List Items Addressed This Visit       Other   IDA (iron deficiency anemia)    Order cbc ibc ferritin pending results Continue daily iron supplementation      Relevant Orders   CBC   IBC + Ferritin   Vitamin D deficiency - Primary    Ordered vitamin d pending results.        Relevant Orders   VITAMIN D 25 Hydroxy (Vit-D Deficiency, Fractures)   Encounter for general adult medical examination with abnormal findings    Patient Counseling(The following topics were reviewed):  Preventative care handout given to pt  Health maintenance and immunizations reviewed. Please refer to Health maintenance section. Pt advised on safe sex, wearing seatbelts in car, and proper nutrition labwork ordered today for annual Dental health: Discussed importance of regular tooth brushing, flossing, and dental visits.       Breast pain, left    Dx mammogram and bil breast u/s ordered pending results.  Suspected breast densities, oil cyst or fibroadenoma.      Relevant Orders   MM DIAG BREAST TOMO BILATERAL   US BREAST LTD UNI LEFT INC AXILLA   US BREAST LTD UNI RIGHT INC AXILLA   Axillary tenderness, left   Relevant Orders   MM DIAG BREAST TOMO  BILATERAL   US BREAST LTD UNI LEFT INC AXILLA   US BREAST LTD UNI RIGHT INC AXILLA   Mass of upper outer quadrant of left breast    Dx bil mammogram and bil breast u/s ordered pending results.  Suspected breast densities, oil cyst or fibroadenoma.      Relevant Orders   MM DIAG BREAST TOMO BILATERAL   US BREAST LTD UNI LEFT INC AXILLA   US BREAST LTD UNI RIGHT INC AXILLA    No orders of the defined types were placed in this encounter.   Follow-up: Return in about 6 months (around 12/14/2022) for f/u PAP.    Mort Sawyers, FNP

## 2022-06-14 NOTE — Assessment & Plan Note (Signed)
Dx bil mammogram and bil breast u/s ordered pending results.  Suspected breast densities, oil cyst or fibroadenoma.

## 2022-07-01 ENCOUNTER — Ambulatory Visit
Admission: RE | Admit: 2022-07-01 | Discharge: 2022-07-01 | Disposition: A | Discharge status: Home / Self Care | Payer: Managed Care, Other (non HMO) | Source: Ambulatory Visit | Attending: Family | Admitting: Family

## 2022-07-01 ENCOUNTER — Ambulatory Visit
Admission: RE | Admit: 2022-07-01 | Discharge: 2022-07-01 | Disposition: A | Discharge status: Home / Self Care | Payer: Managed Care, Other (non HMO) | Source: Ambulatory Visit | Attending: Family

## 2022-07-01 DIAGNOSIS — M79622 Pain in left upper arm: Secondary | ICD-10-CM | POA: Diagnosis present

## 2022-07-01 DIAGNOSIS — N6321 Unspecified lump in the left breast, upper outer quadrant: Secondary | ICD-10-CM

## 2022-07-01 DIAGNOSIS — N644 Mastodynia: Secondary | ICD-10-CM | POA: Diagnosis present

## 2022-07-22 DIAGNOSIS — N644 Mastodynia: Secondary | ICD-10-CM | POA: Diagnosis not present

## 2022-07-22 DIAGNOSIS — N841 Polyp of cervix uteri: Secondary | ICD-10-CM | POA: Diagnosis not present

## 2022-07-22 DIAGNOSIS — Z124 Encounter for screening for malignant neoplasm of cervix: Secondary | ICD-10-CM | POA: Diagnosis not present

## 2022-07-22 DIAGNOSIS — Z01411 Encounter for gynecological examination (general) (routine) with abnormal findings: Secondary | ICD-10-CM | POA: Diagnosis not present

## 2022-08-22 ENCOUNTER — Ambulatory Visit
Admission: RE | Admit: 2022-08-22 | Discharge: 2022-08-22 | Disposition: A | Discharge status: Home / Self Care | Payer: BC Managed Care – PPO | Source: Ambulatory Visit | Attending: Urgent Care | Admitting: Urgent Care

## 2022-08-22 VITALS — BP 128/84 | HR 72 | Temp 98.0°F | Resp 18

## 2022-08-22 DIAGNOSIS — L02215 Cutaneous abscess of perineum: Secondary | ICD-10-CM | POA: Diagnosis not present

## 2022-08-22 MED ORDER — DOXYCYCLINE HYCLATE 100 MG PO CAPS: 100.0000 mg | ORAL_CAPSULE | Freq: Two times a day (BID) | ORAL | 0 refills | Status: AC

## 2022-08-22 NOTE — ED Triage Notes (Signed)
Patient to Urgent Care with complaints of abscess present to the right side of her groin.   Reports area appeared approx five days ago. Denies any drainage. Reports redness and soreness, concern for possible ingrown hair.

## 2022-08-22 NOTE — Discharge Instructions (Signed)
Avoid exposure to direct sun without sunscreen while using antibiotics. Follow up with your primary care provider if your symptoms are worsening or not improving.

## 2022-08-22 NOTE — ED Provider Notes (Signed)
Denise Cox    CSN: [id number] Arrival date & time: 08/22/22  0801      History   Chief Complaint Chief Complaint  Patient presents with   Groin Pain    I have either an ingrown hair or boil on upper pubic area. It has grown and to the point where it is warm to the touch and very sore. - Entered by patient    HPI Denise Cox is a 37 y.o. female.    Groin Pain   Presents with complaint of lesion in pubic area x 5 days. Endorses tenderness and warmth to touch. Denies drainage.   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 Reactive HIV ab/ag, negative for RNA. False positive HIV result.   Migraines 12/15/2014   Formatting of this note might be different from the original. History of neuro deficit(hemiplegia)but migraines resolved with sinus surgery.  Last Assessment & Plan:  Formatting of this note might be different from the original. Had hemiplegic migraine syndrome with hospitilazation in 2007; no problems since then; uses ibuprofen PRN.    Patient Active Problem List   Diagnosis Date Noted   Breast pain, left 06/14/2022   Axillary tenderness, left 06/14/2022   Mass of upper outer quadrant of left breast 06/14/2022   Vitamin D deficiency 11/01/2021   Encounter for general adult medical examination with abnormal findings 11/01/2021   IDA (iron deficiency anemia) 09/20/2019    Past Surgical History:  Procedure Laterality Date   ANKLE SURGERY Right 1998   SINUSOTOMY  2007   WISDOM TOOTH EXTRACTION  2000    OB History     Gravida  1   Para  1   Term  1   Preterm      AB      Living  1      SAB      IAB      Ectopic      Multiple      Live Births  1            Home Medications    Prior to Admission medications   Medication Sig Start Date End Date Taking? Authorizing Provider  Iron,  Ferrous Sulfate, 325 (65 Fe) MG TABS     [provider]    Family History Family History  Problem Relation Age of Onset   Hypertension Mother    Breast cancer Maternal Grandmother    Hypertension Maternal Grandmother    Stomach cancer Maternal Grandmother    Skin cancer Maternal Grandmother    Anxiety disorder Paternal Grandmother    Kidney cancer Paternal Grandmother     Social History Social History   Tobacco Use   Smoking status: Never   Smokeless tobacco: Never  Vaping Use   Vaping Use: Never used  Substance Use Topics   Alcohol use: Yes    Comment: ocassionally   Drug use: Never     Allergies   Augmentin [amoxicillin-pot clavulanate], Avelox [moxifloxacin], and Septra [sulfamethoxazole-trimethoprim]   Review of Systems Review of Systems   Physical Exam Triage Vital Signs ED Triage Vitals  Enc Vitals Group     BP      Pulse      Resp      Temp      Temp src  SpO2      Weight      Height      Head Circumference      Peak Flow      Pain Score      Pain Loc      Pain Edu?      Excl. in [place]?    No data found.  Updated Vital Signs There were no vitals taken for this visit.  Visual Acuity Right Eye Distance:   Left Eye Distance:   Bilateral Distance:    Right Eye Near:   Left Eye Near:    Bilateral Near:     Physical Exam Vitals reviewed.  Constitutional:      Appearance: Normal appearance.  Abdominal:    Skin:    General: Skin is warm and dry.     Findings: Lesion present.  Neurological:     General: No focal deficit present.     Mental Status: She is alert and oriented to person, place, and time.  Psychiatric:        Mood and Affect: Mood normal.        Behavior: Behavior normal.      UC Treatments / Results  Labs (all labs ordered are listed, but only abnormal results are displayed) Labs Reviewed - No data to display  EKG   Radiology No results found.  Procedures Incision and Drainage  Date/Time:  08/22/2022 8:42 AM  Performed by: Rose Phi, FNP Authorized by: Rose Phi, FNP   Consent:    Consent obtained:  Verbal   Consent given by:  Patient   Risks, benefits, and alternatives were discussed: yes     Risks discussed:  Bleeding, incomplete drainage and pain   Alternatives discussed:  No treatment Universal protocol:    Procedure explained and questions answered to patient or proxy's satisfaction: yes     Relevant documents present and verified: yes     Test results available : no     Imaging studies available: no     Required blood products, implants, devices, and special equipment available: no     Site/side marked: no     Immediately prior to procedure, a time out was called: yes     Patient identity confirmed:  Verbally with patient Location:    Type:  Abscess   Size:  3 cm   Location:  Trunk   Trunk location:  Abdomen Pre-procedure details:    Skin preparation:  Povidone-iodine and chlorhexidine with alcohol Sedation:    Sedation type:  None Anesthesia:    Anesthesia method:  Local infiltration   Local anesthetic:  Lidocaine 1% WITH epi Procedure type:    Complexity:  Simple Procedure details:    Ultrasound guidance: no     Needle aspiration: no     Incision types:  Stab incision   Incision depth:  Dermal   Wound management:  Probed and deloculated and irrigated with saline   Drainage:  Purulent   Drainage amount:  Moderate   Wound treatment:  Wound left open and drain placed   Packing materials:  1/4 in iodoform gauze   Amount 1/4" iodoform:  2 cms Post-procedure details:    Procedure completion:  Tolerated with difficulty  (including critical care time)  Medications Ordered in UC Medications - No data to display  Initial Impression / Assessment and Plan / UC Course  I have reviewed the triage vital signs and the nursing notes.  Pertinent labs & imaging results that were available during  my care of the patient were reviewed by me and  considered in my medical decision making (see chart for details).   Abscess, possibly folliculitis vs HS. I&D performed with moderate drainage. See procedure note. Multiple abx intolerances and hx of cdiff following antibiotics.   Final Clinical Impressions(s) / UC Diagnoses   Final diagnoses:  None   Discharge Instructions   None    ED Prescriptions   None    PDMP not reviewed this encounter.   Rose Phi, [place] 08/22/22 [phone]

## 2022-12-17 ENCOUNTER — Other Ambulatory Visit: Payer: Self-pay | Admitting: Family

## 2022-12-17 DIAGNOSIS — N6489 Other specified disorders of breast: Secondary | ICD-10-CM

## 2023-01-13 DIAGNOSIS — J020 Streptococcal pharyngitis: Secondary | ICD-10-CM | POA: Diagnosis not present

## 2023-02-11 DIAGNOSIS — Z20822 Contact with and (suspected) exposure to covid-19: Secondary | ICD-10-CM | POA: Diagnosis not present

## 2023-02-21 DIAGNOSIS — Z1331 Encounter for screening for depression: Secondary | ICD-10-CM | POA: Diagnosis not present

## 2023-02-21 DIAGNOSIS — Z1389 Encounter for screening for other disorder: Secondary | ICD-10-CM | POA: Diagnosis not present

## 2023-02-21 DIAGNOSIS — E559 Vitamin D deficiency, unspecified: Secondary | ICD-10-CM | POA: Diagnosis not present

## 2023-02-21 DIAGNOSIS — R5383 Other fatigue: Secondary | ICD-10-CM | POA: Diagnosis not present

## 2023-02-21 DIAGNOSIS — D500 Iron deficiency anemia secondary to blood loss (chronic): Secondary | ICD-10-CM | POA: Diagnosis not present

## 2023-08-21 ENCOUNTER — Telehealth: Payer: Self-pay | Admitting: Family

## 2023-08-21 NOTE — Telephone Encounter (Signed)
 Spoke with patient and informed her she hasn't seen Tabitha since 2023 and will need an appointment to discuss referral. She stated that she will call later to schedule something. Nothing else is needed.    Copied from CRM [id number]. Topic: Referral - Question >> Aug 21, 2023  9:07 AM Farrel B wrote: Reason for CRM: Patient has called requesting a referral for dermatologist, was seen by urgent care for the rash and given a week worth of steroids with no results was advised to request provider have a referral sent to derm.  Patient stated she hadn't seen the provider in about a year, attempted to schedule, she stated she didn't need to see the provider she needed a referral. Patient contact is [phone]
